# Patient Record
Sex: Male | Born: 1945
Health system: Southern US, Community
[De-identification: ages and names within clinical notes are randomized; demographics above are authoritative.]

## PROBLEM LIST (undated history)

## (undated) ENCOUNTER — Ambulatory Visit: Discharge status: Absent without leave | Payer: Medicare Other

## (undated) DIAGNOSIS — C61 Malignant neoplasm of prostate: Secondary | ICD-10-CM

## (undated) DIAGNOSIS — R972 Elevated prostate specific antigen [PSA]: Secondary | ICD-10-CM

## (undated) DIAGNOSIS — R0600 Dyspnea, unspecified: Secondary | ICD-10-CM

## (undated) DIAGNOSIS — N529 Male erectile dysfunction, unspecified: Secondary | ICD-10-CM

## (undated) DIAGNOSIS — J449 Chronic obstructive pulmonary disease, unspecified: Secondary | ICD-10-CM

## (undated) DIAGNOSIS — R0602 Shortness of breath: Secondary | ICD-10-CM

## (undated) DIAGNOSIS — R519 Headache, unspecified: Secondary | ICD-10-CM

## (undated) HISTORY — PX: ANAL FISSURE REPAIR: SHX2312

## (undated) HISTORY — DX: Male erectile dysfunction, unspecified: N52.9

## (undated) HISTORY — DX: Malignant neoplasm of prostate: C61

## (undated) HISTORY — DX: Elevated prostate specific antigen (PSA): R97.20

---

## 1990-10-30 HISTORY — PX: THORACIC FUSION: SHX1062

## 2005-06-27 ENCOUNTER — Ambulatory Visit (HOSPITAL_COMMUNITY): Admission: RE | Admit: 2005-06-27 | Discharge: 2005-06-27 | Payer: Self-pay | Admitting: Gastroenterology

## 2011-11-15 ENCOUNTER — Other Ambulatory Visit (HOSPITAL_COMMUNITY): Payer: Self-pay | Admitting: Family Medicine

## 2011-11-15 DIAGNOSIS — R0600 Dyspnea, unspecified: Secondary | ICD-10-CM

## 2011-11-20 ENCOUNTER — Ambulatory Visit (HOSPITAL_COMMUNITY)
Admission: RE | Admit: 2011-11-20 | Discharge: 2011-11-20 | Disposition: A | Discharge status: Home / Self Care | Payer: Medicare Other | Source: Ambulatory Visit | Attending: Family Medicine | Admitting: Family Medicine

## 2011-11-20 ENCOUNTER — Other Ambulatory Visit (HOSPITAL_COMMUNITY): Payer: Self-pay | Admitting: Respiratory Therapy

## 2011-11-20 DIAGNOSIS — R0989 Other specified symptoms and signs involving the circulatory and respiratory systems: Secondary | ICD-10-CM | POA: Insufficient documentation

## 2011-11-20 DIAGNOSIS — R0609 Other forms of dyspnea: Secondary | ICD-10-CM | POA: Insufficient documentation

## 2011-11-20 MED ORDER — ALBUTEROL SULFATE (5 MG/ML) 0.5% IN NEBU
2.5000 mg | INHALATION_SOLUTION | Freq: Once | RESPIRATORY_TRACT | Status: AC
  Administered 2011-11-20: 2.5 mg via RESPIRATORY_TRACT

## 2012-02-28 ENCOUNTER — Encounter: Payer: Self-pay | Admitting: Radiation Oncology

## 2012-02-28 DIAGNOSIS — C61 Malignant neoplasm of prostate: Secondary | ICD-10-CM

## 2012-02-28 NOTE — Progress Notes (Signed)
66 year old male. Widowed, 1 son, 1 daughter. Retired.   Patient denies taking any daily prescribed medications.  11/15/2011 PSA 5.3 11/20/2011 PSA 4.1  Prostate biopsy on 02/20/2012. Pathology revealed adenocarcinoma of the prostate Gleason 3+3=6 in 6/12 biopsies in 36 cc gland.   Gleason 6 in 5% of right mid-lateral, 10% of right medial apex, and 5% of left med apex, 5% of mid medial , 20% of left apex and 20% left mid lateral.   Leaning toward seed therapy per Dr. Imelda Pillow 02/27/12 note.    AX: codeine derivatives  No indication of pacemaker No hx of radiation therapy

## 2012-02-29 ENCOUNTER — Encounter: Payer: Self-pay | Admitting: Radiation Oncology

## 2012-02-29 ENCOUNTER — Ambulatory Visit
Admission: RE | Admit: 2012-02-29 | Discharge: 2012-02-29 | Disposition: A | Discharge status: Home / Self Care | Payer: Medicare Other | Source: Ambulatory Visit | Attending: Radiation Oncology | Admitting: Radiation Oncology

## 2012-02-29 VITALS — BP 132/77 | HR 62 | Temp 97.2°F | Resp 18 | Ht 67.0 in | Wt 163.5 lb

## 2012-02-29 DIAGNOSIS — C61 Malignant neoplasm of prostate: Secondary | ICD-10-CM

## 2012-02-29 DIAGNOSIS — F17200 Nicotine dependence, unspecified, uncomplicated: Secondary | ICD-10-CM | POA: Insufficient documentation

## 2012-02-29 DIAGNOSIS — Z8042 Family history of malignant neoplasm of prostate: Secondary | ICD-10-CM | POA: Insufficient documentation

## 2012-02-29 NOTE — Progress Notes (Signed)
Encounter addended by: Agnes Lawrence, RN on: 02/29/2012 10:48 AM<BR>     Documentation filed: Notes Section, Charges VN, Inpatient Patient Education

## 2012-02-29 NOTE — Progress Notes (Signed)
[facility] Radiation Oncology NEW PATIENT EVALUATION  Name: Brent Phelps MRN: [id number]  Date:   02/29/2012           DOB: Jan 26, 1946  Status: outpatient   CC: [name], MD, MD  Kathi Ludwig, *    REFERRING PHYSICIAN: Kathi Ludwig, *   DIAGNOSIS: Stage TI C. favorable risk adenocarcinoma prostate   HISTORY OF PRESENT ILLNESS:  Brent Phelps is a 66 y.o. male who is seen today for the courtesy of Dr. Patsi Sears for discussion of possible radiation therapy in the management of his stage TI C. favorable risk adenocarcinoma prostate. His then have an elevated PSA of 5.3 by Dr. Manus Gunning. This was apparently repeated and found to be 4.1 with a PSA free percentage of 13%. He was seen by Dr. Patsi Sears for further evaluation. He underwent ultrasound-guided biopsies on 02/20/2012. He was then had Gleason 6 (3+3) adenocarcinoma involving 5% of one core from the right lateral mid gland, 10% of one core from the right apex, 20% of one core from the left lateral mid gland, less than 5% of one core from the left mid gland, 20% of one core from left lateral apex and 5% of one core from the left apex. His prostate volume was 36 cc with a prosthetic length of 4.3 cm. He is doing well from a GU and GI standpoint. He previously had an anal fissure repair. His I PSS score is 5. He does have erectile dysfunction. He is not currently sexually active.  PREVIOUS RADIATION THERAPY: No   PAST MEDICAL HISTORY:  has a past medical history of Prostate cancer; Erectile dysfunction; and Elevated prostate specific antigen (PSA).     PAST SURGICAL HISTORY:  Past Surgical History  Procedure Date  . Back surgery   . Back surgery   . Colonoscopy   . Prostate biopsy   . Anal fissure repair      FAMILY HISTORY: family history includes Cancer in his brothers and sister. His father died from a postoperative hemorrhage a 35. His mother is alive and well and lives independently  at age 52. His brother was diagnosed with prostate cancer approximately 15 years ago in his early 31s.   SOCIAL HISTORY:  reports that he has been smoking Cigarettes.  He has a 25 pack-year smoking history. He has never used smokeless tobacco. He reports that he does not drink alcohol or use illicit drugs. Widower for the past 6 years, 2 children. 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   ALLERGIES: Codeine   MEDICATIONS:  Current Outpatient Prescriptions  Medication Sig Dispense Refill  . acetaminophen (TYLENOL) 325 MG tablet Take 650 mg by mouth every 6 (six) hours as needed. As needed         REVIEW OF SYSTEMS:  Pertinent items are noted in HPI.    PHYSICAL EXAM:  height is 5\' 7"  (1.702 m) and weight is 163 lb 8 oz (74.163 kg). His oral temperature is 97.2 F (36.2 C). His blood pressure is 132/77 and his pulse is 62. His respiration is 18.   Alert and oriented 66 year old white male appearing his stated age. Head and neck examination grossly unremarkable. Nodes: Without palpable cervical or supraclavicular lymphadenopathy. Chest: Lungs clear. Back: Without spinal or CVA tenderness. Heart: Regular rate rhythm. Abdomen: Without masses organomegaly. Genitalia: Unremarkable to inspection. Rectal: The prostate gland is normal in size and is without focal induration or nodularity. Extremities: Without  edema. Neurologic examination: Grossly nonfocal.   LABORATORY DATA:  No results found for this basename: WBC, HGB, HCT, MCV, PLT   No results found for this basename: NA, K, CL, CO2   No results found for this basename: ALT, AST, GGT, ALKPHOS, BILITOT   PSA from 11/20/2011   4.1   IMPRESSION: Stage TI C. favorable risk adenocarcinoma prostate. I explained to the patient that his prognosis is related to his stage, PSA level, and Gleason score. All are favorable. We discussed surgery versus close surveillance versus radiation therapy. His radiation therapy  options include seed implantation alone or 8 weeks of external beam/IMRT. We discussed the potential acute and late toxicities of radiation therapy. We also discussed radiation safety issues. Other to radiation therapy options, he would lean toward seed implantation which I think would be a good choice for him. I understand that you'll meet with Dr. Laverle Patter to discuss the option of robotic prostatectomy. The patient to call me if he wants to proceed with radiation therapy. He was given literature for review.   PLAN: As discussed above. He will meet with Dr. Laverle Patter in the near future. The patient is to call me if he wants to proceed with radiation therapy.   I spent 60 minutes minutes face to face with the patient and more than 50% of that time was spent in counseling and/or coordination of care.

## 2012-02-29 NOTE — Progress Notes (Signed)
Patient presents to the clinic today unaccompanied for a new consult with Dr. Dayton Scrape reference prostate ca. Patient is alert and oriented to person, place, and time. No distress noted. Steady gait noted. Pleasant affect noted. Patient denies pain at this time. Patient reports on average he gets up once per night to void. Patient reports his weight remains stable and he has a good appetite. Patient denies burning with urination. Patient denies hematuria. Patient denies incontinence. Patient reports on average he has a BM every 2 or 3rd day and its of normal consistency. Patient reports alteration between a strong and weak urine stream. Patient reports sleeping without difficulty. Patient denies nausea, vomiting, headache and dizziness. Reported all findings to Dr. Dayton Scrape.

## 2012-02-29 NOTE — Progress Notes (Signed)
Complete PATIENT MEASURE OF DISTRESS worksheet with a score of 0 submitted to social work. Also, complete NUTRITION RISK SCREEN worksheet without concerns submitted to Barbara Neff, RD 

## 2012-02-29 NOTE — Progress Notes (Signed)
See progress note under physician encounter. 

## 2012-03-19 ENCOUNTER — Encounter: Payer: Self-pay | Admitting: Radiation Oncology

## 2012-03-19 NOTE — Progress Notes (Signed)
Chart note: I spoke with [name] and he wants to proceed with a prostate seed implant. I feel that he would be a good candidate. He is at town next week, so I'll have him return the first week in June for his CT arch study. I anticipate a seed implant sometime in July.

## 2012-03-20 ENCOUNTER — Telehealth: Payer: Self-pay | Admitting: *Deleted

## 2012-03-20 NOTE — Telephone Encounter (Signed)
Called patient to inform of CT arch study on 04-01-12 at 10:00 a.m., lvm for a return call

## 2012-03-29 ENCOUNTER — Other Ambulatory Visit: Payer: Self-pay | Admitting: Urology

## 2012-03-29 ENCOUNTER — Telehealth: Payer: Self-pay | Admitting: *Deleted

## 2012-03-29 NOTE — Telephone Encounter (Signed)
Called patient to remind of pre-seed appt. For 04-01-12 at 10:00 a.m.Marland Kitchen lvm for a return call

## 2012-04-01 ENCOUNTER — Ambulatory Visit
Admission: RE | Admit: 2012-04-01 | Discharge: 2012-04-01 | Disposition: A | Discharge status: Home / Self Care | Payer: Medicare Other | Source: Ambulatory Visit | Attending: Radiation Oncology | Admitting: Radiation Oncology

## 2012-04-01 ENCOUNTER — Ambulatory Visit: Payer: Medicare Other

## 2012-04-01 ENCOUNTER — Ambulatory Visit (HOSPITAL_BASED_OUTPATIENT_CLINIC_OR_DEPARTMENT_OTHER)
Admission: RE | Admit: 2012-04-01 | Discharge: 2012-04-01 | Disposition: A | Discharge status: Home / Self Care | Payer: Medicare Other | Source: Ambulatory Visit | Attending: Urology | Admitting: Urology

## 2012-04-01 ENCOUNTER — Encounter: Payer: Self-pay | Admitting: Radiation Oncology

## 2012-04-01 DIAGNOSIS — C61 Malignant neoplasm of prostate: Secondary | ICD-10-CM

## 2012-04-01 NOTE — Progress Notes (Signed)
   Complex simulation/treatment planning note:  The patient was taken to the CT simulator for his CT arch study. His pelvis was scanned. I contoured the prostate and projected the prostate over the pubic arch to assess bony arch interference. There is no arch interference. His prostate volume is 35.1 cc with a prosthetic length of 3.9 cm, closely correlating with Dr. Retta Diones volume of 36 cc. I prescribing 14,500 cGy utilizing I-125 seeds. He'll be implanted with Nucletron system. He'll undergo a real-time planning. Consent is signed today.

## 2012-04-03 ENCOUNTER — Telehealth: Payer: Self-pay | Admitting: *Deleted

## 2012-04-03 NOTE — Telephone Encounter (Signed)
Called patient to inform that implant date has been moved to 06-20-12 at 9:30 a.m., lvm for a return call

## 2012-05-29 ENCOUNTER — Telehealth: Payer: Self-pay | Admitting: *Deleted

## 2012-05-29 NOTE — Telephone Encounter (Signed)
CALLED PATIENT TO REMIND OF APPT. FOR 06-17-12, SPOKE WITH PATIENT AND HE IS AWARE OF THIS APPT.

## 2012-06-17 ENCOUNTER — Encounter (HOSPITAL_BASED_OUTPATIENT_CLINIC_OR_DEPARTMENT_OTHER): Payer: Self-pay | Admitting: *Deleted

## 2012-06-17 LAB — CBC
HCT: 42.7 % (ref 39.0–52.0)
MCV: 90.7 fL (ref 78.0–100.0)
RBC: 4.71 MIL/uL (ref 4.22–5.81)
WBC: 5.1 10*3/uL (ref 4.0–10.5)

## 2012-06-17 LAB — COMPREHENSIVE METABOLIC PANEL
BUN: 21 mg/dL (ref 6–23)
CO2: 28 mEq/L (ref 19–32)
Chloride: 102 mEq/L (ref 96–112)
Creatinine, Ser: 0.84 mg/dL (ref 0.50–1.35)
GFR calc non Af Amer: 88 mL/min — ABNORMAL LOW (ref >90)
Total Bilirubin: 0.7 mg/dL (ref 0.3–1.2)

## 2012-06-17 LAB — PROTIME-INR
INR: 0.87 (ref 0.00–1.49)
Prothrombin Time: 12 seconds (ref 11.6–15.2)

## 2012-06-17 NOTE — Progress Notes (Signed)
NPO AFTER MN. ARRIVES AT 0815. CURRENT LAB RESULTS IN EPIC . CURRENT EKG AND CXR IN EPIC AND CHART.  WILL DO FLEET ENEMA AM OF SURG.

## 2012-06-19 ENCOUNTER — Telehealth: Payer: Self-pay | Admitting: *Deleted

## 2012-06-19 NOTE — Telephone Encounter (Signed)
Called patient to remind of procedure for 06-20-12, spoke with patient and he is aware of this procedure.

## 2012-06-20 ENCOUNTER — Encounter (HOSPITAL_BASED_OUTPATIENT_CLINIC_OR_DEPARTMENT_OTHER)
Admission: RE | Disposition: A | Discharge status: Home / Self Care | Payer: Self-pay | Source: Ambulatory Visit | Attending: Urology

## 2012-06-20 ENCOUNTER — Ambulatory Visit (HOSPITAL_COMMUNITY): Payer: Medicare Other

## 2012-06-20 ENCOUNTER — Ambulatory Visit (HOSPITAL_BASED_OUTPATIENT_CLINIC_OR_DEPARTMENT_OTHER)
Admission: RE | Admit: 2012-06-20 | Discharge: 2012-06-20 | Disposition: A | Discharge status: Home / Self Care | Payer: Medicare Other | Source: Ambulatory Visit | Attending: Urology | Admitting: Urology

## 2012-06-20 ENCOUNTER — Ambulatory Visit (HOSPITAL_BASED_OUTPATIENT_CLINIC_OR_DEPARTMENT_OTHER): Payer: Medicare Other | Admitting: Anesthesiology

## 2012-06-20 ENCOUNTER — Encounter (HOSPITAL_BASED_OUTPATIENT_CLINIC_OR_DEPARTMENT_OTHER): Payer: Self-pay | Admitting: Anesthesiology

## 2012-06-20 ENCOUNTER — Encounter: Payer: Self-pay | Admitting: Radiation Oncology

## 2012-06-20 ENCOUNTER — Encounter (HOSPITAL_BASED_OUTPATIENT_CLINIC_OR_DEPARTMENT_OTHER): Payer: Self-pay | Admitting: *Deleted

## 2012-06-20 DIAGNOSIS — C61 Malignant neoplasm of prostate: Secondary | ICD-10-CM | POA: Insufficient documentation

## 2012-06-20 DIAGNOSIS — Z01812 Encounter for preprocedural laboratory examination: Secondary | ICD-10-CM | POA: Insufficient documentation

## 2012-06-20 DIAGNOSIS — N529 Male erectile dysfunction, unspecified: Secondary | ICD-10-CM | POA: Insufficient documentation

## 2012-06-20 DIAGNOSIS — Z8042 Family history of malignant neoplasm of prostate: Secondary | ICD-10-CM | POA: Insufficient documentation

## 2012-06-20 DIAGNOSIS — R972 Elevated prostate specific antigen [PSA]: Secondary | ICD-10-CM | POA: Insufficient documentation

## 2012-06-20 HISTORY — DX: Shortness of breath: R06.02

## 2012-06-20 HISTORY — PX: RADIOACTIVE SEED IMPLANT: SHX5150

## 2012-06-20 HISTORY — PX: CYSTOSCOPY: SHX5120

## 2012-06-20 SURGERY — INSERTION, RADIATION SOURCE, PROSTATE
Anesthesia: General | Site: Prostate | Wound class: Clean Contaminated

## 2012-06-20 MED ORDER — FLEET ENEMA 7-19 GM/118ML RE ENEM: 1.0000 | ENEMA | Freq: Once | RECTAL | Status: DC

## 2012-06-20 MED ORDER — FENTANYL CITRATE 0.05 MG/ML IJ SOLN
INTRAMUSCULAR | Status: DC | PRN
  Administered 2012-06-20 (×4): 25 ug via INTRAVENOUS
  Administered 2012-06-20: 50 ug via INTRAVENOUS
  Administered 2012-06-20 (×2): 25 ug via INTRAVENOUS
  Administered 2012-06-20: 100 ug via INTRAVENOUS

## 2012-06-20 MED ORDER — IOHEXOL 350 MG/ML SOLN
INTRAVENOUS | Status: DC | PRN
  Administered 2012-06-20: 7 mL

## 2012-06-20 MED ORDER — LACTATED RINGERS IV SOLN
INTRAVENOUS | Status: DC
  Administered 2012-06-20: 09:00:00 via INTRAVENOUS

## 2012-06-20 MED ORDER — TAMSULOSIN HCL 0.4 MG PO CAPS: 0.4000 mg | ORAL_CAPSULE | Freq: Every day | ORAL | Status: DC

## 2012-06-20 MED ORDER — STERILE WATER FOR IRRIGATION IR SOLN
Status: DC | PRN
  Administered 2012-06-20: 3000 mL

## 2012-06-20 MED ORDER — TRAMADOL-ACETAMINOPHEN 37.5-325 MG PO TABS: 1.0000 | ORAL_TABLET | Freq: Four times a day (QID) | ORAL | Status: AC | PRN

## 2012-06-20 MED ORDER — KETOROLAC TROMETHAMINE 30 MG/ML IJ SOLN
INTRAMUSCULAR | Status: DC | PRN
Start: 2012-06-20 — End: 2012-06-20
  Administered 2012-06-20: 30 mg via INTRAVENOUS

## 2012-06-20 MED ORDER — DEXAMETHASONE SODIUM PHOSPHATE 4 MG/ML IJ SOLN
INTRAMUSCULAR | Status: DC | PRN
  Administered 2012-06-20: 10 mg via INTRAVENOUS

## 2012-06-20 MED ORDER — GLYCOPYRROLATE 0.2 MG/ML IJ SOLN
INTRAMUSCULAR | Status: DC | PRN
  Administered 2012-06-20 (×2): 0.2 mg via INTRAVENOUS

## 2012-06-20 MED ORDER — LACTATED RINGERS IV SOLN
INTRAVENOUS | Status: DC | PRN
  Administered 2012-06-20 (×3): via INTRAVENOUS

## 2012-06-20 MED ORDER — MIDAZOLAM HCL 5 MG/5ML IJ SOLN
INTRAMUSCULAR | Status: DC | PRN
  Administered 2012-06-20: 1 mg via INTRAVENOUS
  Administered 2012-06-20: 2 mg via INTRAVENOUS

## 2012-06-20 MED ORDER — FENTANYL CITRATE 0.05 MG/ML IJ SOLN: 25.0000 ug | INTRAMUSCULAR | Status: DC | PRN

## 2012-06-20 MED ORDER — PROMETHAZINE HCL 25 MG/ML IJ SOLN: 6.2500 mg | INTRAMUSCULAR | Status: DC | PRN

## 2012-06-20 MED ORDER — ACETAMINOPHEN 10 MG/ML IV SOLN
INTRAVENOUS | Status: DC | PRN
  Administered 2012-06-20: 1000 mg via INTRAVENOUS

## 2012-06-20 MED ORDER — LIDOCAINE HCL (CARDIAC) 20 MG/ML IV SOLN
INTRAVENOUS | Status: DC | PRN
  Administered 2012-06-20: 100 mg via INTRAVENOUS

## 2012-06-20 MED ORDER — PROPOFOL 10 MG/ML IV EMUL
INTRAVENOUS | Status: DC | PRN
  Administered 2012-06-20: 200 mg via INTRAVENOUS

## 2012-06-20 MED ORDER — CIPROFLOXACIN IN D5W 400 MG/200ML IV SOLN
400.0000 mg | INTRAVENOUS | Status: AC
  Administered 2012-06-20: 400 mg via INTRAVENOUS

## 2012-06-20 MED ORDER — CIPROFLOXACIN HCL 500 MG PO TABS: 500.0000 mg | ORAL_TABLET | Freq: Two times a day (BID) | ORAL | Status: AC

## 2012-06-20 MED ORDER — CIPROFLOXACIN IN D5W 400 MG/200ML IV SOLN
INTRAVENOUS | Status: DC | PRN
  Administered 2012-06-20: 400 mg via INTRAVENOUS

## 2012-06-20 MED ORDER — ONDANSETRON HCL 4 MG/2ML IJ SOLN
INTRAMUSCULAR | Status: DC | PRN
  Administered 2012-06-20: 4 mg via INTRAVENOUS

## 2012-06-20 SURGICAL SUPPLY — 28 items
BAG URINE DRAINAGE (UROLOGICAL SUPPLIES) ×4 IMPLANT
BLADE SURG ROTATE 9660 (MISCELLANEOUS) ×4 IMPLANT
CATH FOLEY 2WAY SLVR  5CC 16FR (CATHETERS) ×2
CATH FOLEY 2WAY SLVR 5CC 16FR (CATHETERS) ×6 IMPLANT
CATH ROBINSON RED A/P 20FR (CATHETERS) ×4 IMPLANT
CLOTH BEACON ORANGE TIMEOUT ST (SAFETY) ×4 IMPLANT
COVER MAYO STAND STRL (DRAPES) ×4 IMPLANT
COVER TABLE BACK 60X90 (DRAPES) ×4 IMPLANT
DRAPE CAMERA CLOSED 9X96 (DRAPES) ×4 IMPLANT
DRSG TEGADERM 4X4.75 (GAUZE/BANDAGES/DRESSINGS) ×4 IMPLANT
DRSG TEGADERM 8X12 (GAUZE/BANDAGES/DRESSINGS) ×4 IMPLANT
GLOVE BIO SURGEON STRL SZ7.5 (GLOVE) ×13 IMPLANT
GLOVE ECLIPSE 8.0 STRL XLNG CF (GLOVE) IMPLANT
GLOVE INDICATOR 6.5 STRL GRN (GLOVE) ×4 IMPLANT
GOWN STRL NON-REIN LRG LVL3 (GOWN DISPOSABLE) ×1 IMPLANT
GOWN STRL REIN XL XLG (GOWN DISPOSABLE) ×4 IMPLANT
GOWN XL W/COTTON TOWEL STD (GOWNS) ×2 IMPLANT
HOLDER FOLEY CATH W/STRAP (MISCELLANEOUS) ×4 IMPLANT
IV WATER IRR. 1000ML (IV SOLUTION) ×3 IMPLANT
NUCLETRON SELECTSEED ×1 IMPLANT
PACK CYSTOSCOPY (CUSTOM PROCEDURE TRAY) ×4 IMPLANT
SPONGE GAUZE 4X4 12PLY (GAUZE/BANDAGES/DRESSINGS) ×1 IMPLANT
SYRINGE 10CC LL (SYRINGE) ×4 IMPLANT
SYRINGE IRR TOOMEY STRL 70CC (SYRINGE) ×1 IMPLANT
TOWEL NATURAL 6PK STERILE (DISPOSABLE) ×2 IMPLANT
UNDERPAD 30X30 INCONTINENT (UNDERPADS AND DIAPERS) ×8 IMPLANT
WATER STERILE IRR 3000ML UROMA (IV SOLUTION) ×2 IMPLANT
WATER STERILE IRR 500ML POUR (IV SOLUTION) ×4 IMPLANT

## 2012-06-20 NOTE — Anesthesia Preprocedure Evaluation (Signed)
Anesthesia Evaluation  Patient identified by MRN, date of birth, ID band Patient awake    Reviewed: Allergy & Precautions, H&P , NPO status , Patient's Chart, lab work & pertinent test results  Airway Mallampati: II TM Distance: >3 FB Neck ROM: Full    Dental No notable dental hx.    Pulmonary shortness of breath,  breath sounds clear to auscultation  Pulmonary exam normal       Cardiovascular negative cardio ROS  Rhythm:Regular Rate:Normal     Neuro/Psych PSYCHIATRIC DISORDERS negative neurological ROS     GI/Hepatic negative GI ROS, Neg liver ROS,   Endo/Other  negative endocrine ROS  Renal/GU negative Renal ROS  negative genitourinary   Musculoskeletal negative musculoskeletal ROS (+)   Abdominal   Peds negative pediatric ROS (+)  Hematology negative hematology ROS (+)   Anesthesia Other Findings   Reproductive/Obstetrics negative OB ROS                           Anesthesia Physical Anesthesia Plan  ASA: II  Anesthesia Plan: General   Post-op Pain Management:    Induction: Intravenous  Airway Management Planned: LMA  Additional Equipment:   Intra-op Plan:   Post-operative Plan: Extubation in OR  Informed Consent: I have reviewed the patients History and Physical, chart, labs and discussed the procedure including the risks, benefits and alternatives for the proposed anesthesia with the patient or authorized representative who has indicated his/her understanding and acceptance.   Dental advisory given  Plan Discussed with: CRNA  Anesthesia Plan Comments:         Anesthesia Quick Evaluation

## 2012-06-20 NOTE — Transfer of Care (Signed)
Immediate Anesthesia Transfer of Care Note  Patient: Brent Phelps  Procedure(s) Performed: Procedure(s) (LRB): RADIOACTIVE SEED IMPLANT (N/A) CYSTOSCOPY FLEXIBLE (N/A) CYSTOSCOPY ()  Patient Location: PACU  Anesthesia Type: General  Level of Consciousness: awake, sedated, patient cooperative and responds to stimulation  Airway & Oxygen Therapy: Patient Spontanous Breathing and Patient connected to face mask oxygen  Post-op Assessment: Report given to PACU RN, Post -op Vital signs reviewed and stable and Patient moving all extremities  Post vital signs: Reviewed and stable  Complications: No apparent anesthesia complications

## 2012-06-20 NOTE — Progress Notes (Signed)
End of treatment summary:  Diagnosis: Stage TI C. favorable risk adenocarcinoma prostate  Requesting physician: Dr. Alexis Frock  Intent: Curative  Implant date: 06/20/2012  Site/dose: Prostate 14,500 cGy  Isotope: I-125 utilizing 83 seeds and 28 active needles. Individual seed activity 0.427 mCi per seed for a total implant activity of 35.4 mCi  Narrative: [name] appears to undergone a successful Nucletron seed Selectron implant with Dr. Patsi Sears.  Plan: Followup visit in 3 weeks to see me and Dr. Patsi Sears. He'll have a CT scan at that time to assess the quality of his implant.

## 2012-06-20 NOTE — H&P (Signed)
omplaint  cc: Dr. Madie Phelps   Active Problems Problems  1. Benign Localized Prostatic Hyperplasia With Urinary Obstruction 600.21 2. Male Erectile Disorder Due To Physical Condition 607.84 3. Fraternal history of  Prostate Cancer V16.42 4. Prostate Cancer 185 5. PSA,Elevated 790.93  History of Present Illness                66 yo widowed male returns today s/p prostate biopsy on 02/20/12, originally referred by Dr. Manus Phelps for elevated PSA of 4.1/13% on 11-20-11. ( Was 5.3 on original test).     Patient has a weak flow & ED. IPSS=7/7.  Bx= G 3+3=6 caP in 6/12 biopsies, in 36cc gland:  He has G 6 in 5% of R mid-lateral biopsies, and 10% of R medial apes; and G=6 in 5% of L med apex, and G=6 in 5% of mid-medial biopsies. Note G=6 in 20% of L apex; and 20% in L mid-lateral biopsies. We have discussed determinants of treatment, including his general health and his expectations. He has only 1% chance of spread of disease ( Partin tables), and does not need CT or bone scan. He therefore needs to consider treatments, which fall into 3 ctegories: non-surgical; treatments for cure; and treatments for advanced disease.   We have discussed (1) no treatment option-for patients who are dying from other diseases before the prostate could become a lyfestyle factor; (2) watchful waiting therapy, for patients with low grade disease and other diseases, eg: heart disease, which may preclude treatment unless disease spreads; (3) watchful waiting protocol, with repeat psas and repeat biopsy in 10-16 months; (4) treatment for cure with LRRP; radiation with I-125 seed Rs, EBRT and IMRT. We have discussed the risk/benefit of all treatments.Other Rx include hormone therapy, chemotherapy, research therapies-none of which he needs now.  We have discussed 2nd opinions with Dr. Laverle Phelps and Dr. Dayton Phelps. He is given literature to read and will RTC to discuss his options again. He has decided  to have appointment with 2nd  opinion-Dr. Dayton Phelps, so I will arrange for him. He is leaning toward seed therapy.   Past Medical History Problems  1. History of  No Medical Problems  Surgical History Problems  1. History of  Back Surgery 2. History of  Back Surgery 3. History of  Complete Colonoscopy  Current Meds 1. No Reported Medications  Allergies Medication  1. Codeine Derivatives  Family History Problems  1. Fraternal history of  Colon Cancer V16.0 2. Family history of  Family Health Status - Mother's Age 70. Family history of  Family Health Status Number Of Children 1 son, 1 daughter 4. Family history of  Father Deceased At Age 49 post-op shock 5. Fraternal history of  Prostate Cancer V16.42  Social History Problems  1. Caffeine Use 1 per day 2. Marital History - Widowed 3. Occupation: Retired 4. Smoking Cigarettes 305.1 3/4 ppd x 37yrs Denied  5. History of  Alcohol Use  Vitals Vital Signs [Data Includes: Last 1 Day]  21May2013 08:14AM  BMI Calculated: 25.74 BSA Calculated: 1.86 Height: 5 ft 7 in Weight: 164 lb  Blood Pressure: 141 / 84 Temperature: 97.7 F Heart Rate: 65  Physical Exam Rectal: Rectal exam demonstrates decreased sphincter tone, no tenderness and no masses. Estimated prostate size is 3+. Normal rectal tone, no rectal masses, prostate is smooth, symmetric and non-tender. The prostate has no nodularity and is not tender. The left seminal vesicle is nonpalpable. The right seminal vesicle is nonpalpable. The perineum is normal  on inspection.    Results/Data Urine [Data Includes: Last 1 Day]   21May2013  COLOR YELLOW   APPEARANCE CLEAR   SPECIFIC GRAVITY <1.005   pH 6.0   GLUCOSE NEG mg/dL  BILIRUBIN NEG   KETONE NEG mg/dL  BLOOD TRACE   PROTEIN NEG mg/dL  UROBILINOGEN 0.2 mg/dL  NITRITE NEG   LEUKOCYTE ESTERASE NEG   SQUAMOUS EPITHELIAL/HPF RARE   WBC NONE SEEN WBC/hpf  RBC NONE SEEN RBC/hpf  BACTERIA NONE SEEN   CRYSTALS NONE SEEN   CASTS NONE SEEN     Assessment Assessed  1. Benign Localized Prostatic Hyperplasia With Urinary Obstruction 600.21 2. Male Erectile Disorder Due To Physical Condition 607.84 3. Fraternal history of  Prostate Cancer V16.42 4. Prostate Cancer 185 5. PSA,Elevated 790.93   66 yo male with G-6 multifocal T1C CaP with strong family genetic link to CaP ( brother had RRP), in 35cc gland with PSA 4.1-5.3. PMH tobacco use and ED. He has decided to have Radiation therapy, and we have discussed seed therapies including:  I 125 seeds alone, EBRT + seeds; hormone (6 month)+ seeds; hormone ( 6 month)+ EBRT + seeds. I have discouraged IMRT because I don't think he has aggressive enough disease ( multifocal, but only G6) to warrent  potential of long term side effects of high dose radiation ( bleeding, etc). I would ask for Dr. Rennie Phelps advice in this regard and scheduling.    Plan     Ask Dr.Murray to proceed with plan for I-125 seeds. He will need Arch study per Dr, Brent Phelps.   Signatures Electronically signed by : Jethro Bolus, M.D.; Mar 19 2012  8:55AM

## 2012-06-20 NOTE — Op Note (Signed)
Pre-operative diagnosis : Adenocarcinoma prostate, stage TI C.  Postoperative diagnosis: Same  Operation: I-125 seed implantation  Surgeon:  S. Patsi Sears, MD  First assistant: None  Radiation Therapist: Dr. Chipper Herb  Anesthesia:  general  Preparation: After appropriate preanesthesia, the patient was brought to the operative room, placed on the operating table in the dorsal supine position, where general LMA anesthesia was introduced. The arm band was double checked. He was then placed in the dorsal lithotomy position with pubis was prepped with Betadine solution and draped in usual fashion.  Review history: Active Problems  Problems  1. Benign Localized Prostatic Hyperplasia With Urinary Obstruction 600.21  2. Male Erectile Disorder Due To Physical Condition 607.84  3. Fraternal history of Prostate Cancer V16.42  4. Prostate Cancer 185  5. PSA,Elevated 790.93  History of Present Illness  66 yo widowed male returns today s/p prostate biopsy on 02/20/12, originally referred by Dr. Manus Gunning for elevated PSA of 4.1/13% on 11-20-11. ( Was 5.3 on original test).  Patient has a weak flow & ED. IPSS=7/7. Bx= G 3+3=6 caP in 6/12 biopsies, in 36cc gland:  He has G 6 in 5% of R mid-lateral biopsies, and 10% of R medial apes; and G=6 in 5% of L med apex, and G=6 in 5% of mid-medial biopsies. Note G=6 in 20% of L apex; and 20% in L mid-lateral biopsies. We have discussed determinants of treatment, including his general health and his expectations. He has only 1% chance of spread of disease ( Partin tables), and does not need CT or bone scan. He therefore needs to consider treatments, which fall into 3 ctegories: non-surgical; treatments for cure; and treatments for advanced disease.    Statement of  Likelihood of Success: Excellent. TIME-OUT observed.:  Procedure: Following real-time ultrasound planning, the patient underwent I-125 seed implantation, with 83 seeds, via 30 needles. The patient  had a large left seminal vesicle cyst identified. Following implantation of the seeds, x-ray documentation was accomplished. Following this, flexural cystoscopy was accomplished, which shows spacer end within the bladder. There was some clot in the bladder. No seed could be identified within the bladder, or the prostate. Clot was evacuated from the bladder, and was evaluated, but again, no radioactivity was identified. No seed was identified. Rigid cystoscopy was accomplished, and again,or cecum be identified. No radioactivity could be identified as well.  The Foley catheter was placed to straight drainage, with 10 cc in the balloon. The patient received IV Tylenol, IV Toradol, as well as B. and O. suppository. He was awakened, taken to recovery room in good condition.

## 2012-06-20 NOTE — Interval H&P Note (Signed)
History and Physical Interval Note:  06/20/2012 10:45 AM  Brent Phelps  has presented today for surgery, with the diagnosis of PROSTATE CANCER  The various methods of treatment have been discussed with the patient and family. After consideration of risks, benefits and other options for treatment, the patient has consented to  Procedure(s) (LRB): RADIOACTIVE SEED IMPLANT (N/A) CYSTOSCOPY FLEXIBLE (N/A) as a surgical intervention .  The patient's history has been reviewed, patient examined, no change in status, stable for surgery.  I have reviewed the patient's chart and labs.  Questions were answered to the patient's satisfaction.     Jethro Bolus I

## 2012-06-20 NOTE — Anesthesia Procedure Notes (Signed)
Procedure Name: LMA Insertion Date/Time: 06/20/2012 11:34 AM Performed by: Jessica Priest Pre-anesthesia Checklist: Patient identified, Emergency Drugs available, Suction available and Patient being monitored Patient Re-evaluated:Patient Re-evaluated prior to inductionOxygen Delivery Method: Circle System Utilized Preoxygenation: Pre-oxygenation with 100% oxygen Intubation Type: IV induction Ventilation: Mask ventilation without difficulty LMA: LMA inserted LMA Size: 4.0 Number of attempts: 1 Airway Equipment and Method: bite block Placement Confirmation: positive ETCO2 Tube secured with: Tape Dental Injury: Teeth and Oropharynx as per pre-operative assessment

## 2012-06-20 NOTE — Progress Notes (Signed)
[facility] Radiation Oncology Brachytherapy Operative Procedure Note  Name: Brent Phelps MRN: [id number]  Date:   03/29/2012           DOB: 02-19-1946  Status:outpatient    EX:[id number],GMWNUU R, MD  Dr. Alexis Frock DIAGNOSIS: A 66 year old gentlemen with stage T1 C. adenocarcinoma of the prostate with a Gleason of 6 and a PSA of 5.3.  PROCEDURE: Insertion of radioactive I-125 seeds into the prostate gland.  RADIATION DOSE: 145 Gy, definitive therapy.  TECHNIQUE: [name] was brought to the operating room with Dr. Patsi Sears. He was placed in the dorsolithotomy position. He was catheterized and a rectal tube was inserted. The perineum was shaved, prepped and draped. The ultrasound probe was then introduced into the rectum to see the prostate gland.  TREATMENT DEVICE: A needle grid was attached to the ultrasound probe stand and anchor needles were placed.  COMPLEX ISODOSE CALCULATION: The prostate was imaged in 3D using a sagittal sweep of the prostate probe. These images were transferred to the planning computer. There, the prostate, urethra and rectum were defined on each axial reconstructed image. Then, the software created an optimized plan and a few seed positions were adjusted. Then the accepted plan was uploaded to the seed Selectron afterloading unit.  SPECIAL TREATMENT PROCEDURE/SUPERVISION AND HANDLING: The Nucletron FIRST system was used to place the needles under sagittal guidance. A total of 28 needles were used to deposit 83 seeds in the prostate gland. The individual seed activity was 0.43 mCi for a total implant activity of 35.4 mCi.  COMPLEX SIMULATION: At the end of the procedure, an anterior radiograph of the pelvis was obtained to document seed positioning and count. Cystoscopy was performed to check the urethra and bladder.  MICRODOSIMETRY: At the end of the procedure, the patient was emitting 0.8 mrem/hr at 1 meter. Accordingly, he was  considered safe for hospital discharge.  PLAN: The patient will return to the radiation oncology clinic for post implant CT dosimetry in three weeks.

## 2012-06-21 ENCOUNTER — Encounter (HOSPITAL_BASED_OUTPATIENT_CLINIC_OR_DEPARTMENT_OTHER): Payer: Self-pay | Admitting: Urology

## 2012-06-21 NOTE — Anesthesia Postprocedure Evaluation (Signed)
  Anesthesia Post Note  Patient: Brent Phelps  Procedure(s) Performed: Procedure(s) (LRB): RADIOACTIVE SEED IMPLANT (N/A) CYSTOSCOPY FLEXIBLE (N/A) CYSTOSCOPY ()  Anesthesia type: General  Patient location: PACU  Post pain: Pain level controlled  Post assessment: Post-op Vital signs reviewed  Last Vitals:  Filed Vitals:   06/20/12 1529  BP: 114/78  Pulse: 64  Temp: 36.1 C  Resp: 18    Post vital signs: Reviewed  Level of consciousness: sedated  Complications: No apparent anesthesia complications

## 2012-06-25 NOTE — Procedures (Signed)
Author:  Maryln Gottron, MD  Service:  Radiation Oncology  Author Type:  Physician   Filed:  06/20/12 1523  Note Time:  06/20/12 1520          [facility]  Radiation Oncology  Brachytherapy Operative Procedure Note  Name: Brent Phelps MRN: [id number]  Date: 03/29/2012 DOB: Apr 05, 1946  Status:outpatient  WU:[id number] R, MD Dr. Alexis Frock  DIAGNOSIS: A 66 year old gentlemen with stage T1 C. adenocarcinoma of the prostate with a Gleason of 6 and a PSA of 5.3.  PROCEDURE: Insertion of radioactive I-125 seeds into the prostate gland.  RADIATION DOSE: 145 Gy, definitive therapy.  TECHNIQUE: [name] was brought to the operating room with Dr. Patsi Sears. He was placed in the dorsolithotomy position. He was catheterized and a rectal tube was inserted. The perineum was shaved, prepped and draped. The ultrasound probe was then introduced into the rectum to see the prostate gland.  TREATMENT DEVICE: A needle grid was attached to the ultrasound probe stand and anchor needles were placed.  COMPLEX ISODOSE CALCULATION: The prostate was imaged in 3D using a sagittal sweep of the prostate probe. These images were transferred to the planning computer. There, the prostate, urethra and rectum were defined on each axial reconstructed image. Then, the software created an optimized plan and a few seed positions were adjusted. Then the accepted plan was uploaded to the seed Selectron afterloading unit.  SPECIAL TREATMENT PROCEDURE/SUPERVISION AND HANDLING: The Nucletron FIRST system was used to place the needles under sagittal guidance. A total of 28 needles were used to deposit 83 seeds in the prostate gland. The individual seed activity was 0.43 mCi for a total implant activity of 35.4 mCi.  COMPLEX SIMULATION: At the end of the procedure, an anterior radiograph of the pelvis was obtained to document seed positioning and count. Cystoscopy was performed to check the urethra and bladder.   MICRODOSIMETRY: At the end of the procedure, the patient was emitting 0.8 mrem/hr at 1 meter. Accordingly, he was considered safe for hospital discharge.  PLAN: The patient will return to the radiation oncology clinic for post implant CT dosimetry in three weeks.

## 2012-07-04 ENCOUNTER — Telehealth: Payer: Self-pay | Admitting: *Deleted

## 2012-07-04 NOTE — Telephone Encounter (Signed)
CALLED PATIENT TO INFORM OF APPT. CHANGES FOR 07-10-12 TO 07-17-12, LVM FOR A RETURN CALL

## 2012-07-10 ENCOUNTER — Ambulatory Visit: Payer: Medicare Other | Admitting: Radiation Oncology

## 2012-07-12 ENCOUNTER — Encounter: Payer: Self-pay | Admitting: Radiation Oncology

## 2012-07-16 ENCOUNTER — Telehealth: Payer: Self-pay | Admitting: *Deleted

## 2012-07-16 NOTE — Telephone Encounter (Signed)
CALLED PATIENT TO REMIND OF APPTS. FOR 07-17-12, LVM FOR A RETURN CALL

## 2012-07-17 ENCOUNTER — Ambulatory Visit
Admission: RE | Admit: 2012-07-17 | Discharge: 2012-07-17 | Disposition: A | Discharge status: Home / Self Care | Payer: Medicare Other | Source: Ambulatory Visit | Attending: Radiation Oncology | Admitting: Radiation Oncology

## 2012-07-17 ENCOUNTER — Encounter: Payer: Self-pay | Admitting: Radiation Oncology

## 2012-07-17 VITALS — BP 124/74 | HR 75 | Temp 97.7°F | Resp 20 | Wt 164.1 lb

## 2012-07-17 DIAGNOSIS — C61 Malignant neoplasm of prostate: Secondary | ICD-10-CM

## 2012-07-17 DIAGNOSIS — Z8042 Family history of malignant neoplasm of prostate: Secondary | ICD-10-CM | POA: Insufficient documentation

## 2012-07-17 DIAGNOSIS — F17200 Nicotine dependence, unspecified, uncomplicated: Secondary | ICD-10-CM | POA: Insufficient documentation

## 2012-07-17 NOTE — Progress Notes (Signed)
Complex simulation note: The patient was taken to the CT simulator. His prostate/pelvis was scanned. The CT data set was sent to the [facility] system for contouring of his prostate and rectum. We will then proceed with 3-D simulation to assess the quality of his seed implant.

## 2012-07-17 NOTE — Progress Notes (Signed)
Followup note:  Brent Phelps returns today approximately 1 month following his seed implant with Dr. Patsi Sears in the management of his stage TI C. favorable risk adenocarcinoma prostate. He is doing well from a GU and GI standpoint. He saw Dr. Patsi Sears last week and he'll see Dr. Patsi Sears back in December. His CT scan for his post implant dosimetry shows what appears to be an excellent seed distribution.  Physical examination: Not performed today  Impression: Satisfactory progress.  Plan: We'll move ahead with his post implant dosimetry and for the results of Dr. Patsi Sears. Dr. Patsi Sears will see the patient back for a followup visit and PSA determination in December. I have not scheduled the patient for a formal followup visit and I asked that Dr. Patsi Sears keep me posted on his progress.

## 2012-07-17 NOTE — Progress Notes (Signed)
Patient here for follow up post seed prostate implantation Done on 06/20/12  Patient alert,oriented x3 No dysuria, bowel movements good, urgency still but getting better,  No c/o pain, eating and drinking well stated 11:11 AM

## 2012-09-17 ENCOUNTER — Ambulatory Visit
Admission: RE | Admit: 2012-09-17 | Discharge: 2012-09-17 | Disposition: A | Discharge status: Home / Self Care | Payer: Medicare Other | Source: Ambulatory Visit | Attending: Radiation Oncology | Admitting: Radiation Oncology

## 2012-09-17 DIAGNOSIS — C61 Malignant neoplasm of prostate: Secondary | ICD-10-CM | POA: Insufficient documentation

## 2012-09-18 ENCOUNTER — Encounter: Payer: Self-pay | Admitting: Radiation Oncology

## 2012-09-18 NOTE — Progress Notes (Signed)
Post implant CT dosimetry/3-D simulation note: [name] underwent post-implant CT dosimetry/3-D simulation on 09/17/2012 to assess the quality of his prostate seed implant. Dose volume histograms were obtained for the prostate and rectum. His intraoperative prostate volume by ultrasound was 45.4 cc while his postoperative volume by CT was 38.0 cc, a reasonable correlation. His prostate D 90 was 110.1% and V100 93.73%, both excellent. Only 0.54 cc of rectum received the prescribed dose of 14,500 cGy. In summary, the patient has excellent post implant dosimetry with a low-risk for late rectal toxicity.  CC: Dr. Oda Kilts, Dr. Jeanmarie Hubert

## 2013-05-12 ENCOUNTER — Emergency Department (HOSPITAL_COMMUNITY)
Admission: EM | Admit: 2013-05-12 | Discharge: 2013-05-12 | Disposition: A | Discharge status: Home / Self Care | Payer: Medicare Other | Attending: Emergency Medicine | Admitting: Emergency Medicine

## 2013-05-12 ENCOUNTER — Encounter (HOSPITAL_COMMUNITY)
Admission: EM | Disposition: A | Discharge status: Home / Self Care | Payer: Self-pay | Source: Home / Self Care | Attending: Emergency Medicine

## 2013-05-12 ENCOUNTER — Emergency Department (HOSPITAL_COMMUNITY): Payer: Medicare Other

## 2013-05-12 ENCOUNTER — Encounter (HOSPITAL_COMMUNITY): Payer: Self-pay | Admitting: Anesthesiology

## 2013-05-12 ENCOUNTER — Emergency Department (HOSPITAL_COMMUNITY): Payer: Medicare Other | Admitting: Anesthesiology

## 2013-05-12 ENCOUNTER — Encounter (HOSPITAL_COMMUNITY): Payer: Self-pay | Admitting: *Deleted

## 2013-05-12 DIAGNOSIS — X58XXXA Exposure to other specified factors, initial encounter: Secondary | ICD-10-CM | POA: Insufficient documentation

## 2013-05-12 DIAGNOSIS — S0530XA Ocular laceration without prolapse or loss of intraocular tissue, unspecified eye, initial encounter: Secondary | ICD-10-CM | POA: Insufficient documentation

## 2013-05-12 DIAGNOSIS — S0232XA Fracture of orbital floor, left side, initial encounter for closed fracture: Secondary | ICD-10-CM

## 2013-05-12 DIAGNOSIS — S0592XA Unspecified injury of left eye and orbit, initial encounter: Secondary | ICD-10-CM

## 2013-05-12 DIAGNOSIS — S0280XA Fracture of other specified skull and facial bones, unspecified side, initial encounter for closed fracture: Secondary | ICD-10-CM | POA: Insufficient documentation

## 2013-05-12 HISTORY — PX: RUPTURED GLOBE EXPLORATION AND REPAIR: SHX2366

## 2013-05-12 LAB — BASIC METABOLIC PANEL
BUN: 19 mg/dL (ref 6–23)
Chloride: 102 mEq/L (ref 96–112)
GFR calc Af Amer: >90 mL/min (ref >90)
Potassium: 3.8 mEq/L (ref 3.5–5.1)

## 2013-05-12 LAB — CBC
HCT: 39.2 % (ref 39.0–52.0)
RBC: 4.43 MIL/uL (ref 4.22–5.81)
RDW: 13.1 % (ref 11.5–15.5)
WBC: 3.8 10*3/uL — ABNORMAL LOW (ref 4.0–10.5)

## 2013-05-12 SURGERY — REPAIR, RUPTURE, GLOBE
Anesthesia: General | Laterality: Left | Wound class: Clean

## 2013-05-12 MED ORDER — MORPHINE SULFATE 4 MG/ML IJ SOLN
4.0000 mg | INTRAMUSCULAR | Status: DC | PRN
  Administered 2013-05-12: 4 mg via INTRAVENOUS
  Filled 2013-05-12: qty 1

## 2013-05-12 MED ORDER — SUFENTANIL CITRATE 50 MCG/ML IV SOLN
INTRAVENOUS | Status: DC | PRN
  Administered 2013-05-12 (×2): 5 ug via INTRAVENOUS
  Administered 2013-05-12: 10 ug via INTRAVENOUS

## 2013-05-12 MED ORDER — MIDAZOLAM HCL 5 MG/5ML IJ SOLN
INTRAMUSCULAR | Status: DC | PRN
  Administered 2013-05-12: .5 mg via INTRAVENOUS

## 2013-05-12 MED ORDER — TOBRAMYCIN 0.3 % OP OINT
TOPICAL_OINTMENT | OPHTHALMIC | Status: DC | PRN
  Administered 2013-05-12: 1 via OPHTHALMIC

## 2013-05-12 MED ORDER — PROMETHAZINE HCL 25 MG/ML IJ SOLN: 6.2500 mg | INTRAMUSCULAR | Status: DC | PRN

## 2013-05-12 MED ORDER — TRIAMCINOLONE ACETONIDE 40 MG/ML IJ SUSP
INTRAMUSCULAR | Status: AC
  Filled 2013-05-12: qty 5

## 2013-05-12 MED ORDER — GENTAMICIN SULFATE 40 MG/ML IJ SOLN
INTRAMUSCULAR | Status: DC | PRN
  Administered 2013-05-12: .25 mg

## 2013-05-12 MED ORDER — EPINEPHRINE HCL 1 MG/ML IJ SOLN
INTRAMUSCULAR | Status: AC
  Filled 2013-05-12: qty 1

## 2013-05-12 MED ORDER — PROPOFOL 10 MG/ML IV BOLUS
INTRAVENOUS | Status: DC | PRN
  Administered 2013-05-12: 150 mg via INTRAVENOUS

## 2013-05-12 MED ORDER — ONDANSETRON HCL 4 MG/2ML IJ SOLN
4.0000 mg | Freq: Once | INTRAMUSCULAR | Status: AC
  Administered 2013-05-12: 4 mg via INTRAVENOUS

## 2013-05-12 MED ORDER — HYPROMELLOSE 2 % IO SOLN
INTRAOCULAR | Status: DC | PRN
  Administered 2013-05-12: 1 mL via INTRAOCULAR

## 2013-05-12 MED ORDER — HYPROMELLOSE (GONIOSCOPIC) 2.5 % OP SOLN
OPHTHALMIC | Status: AC
  Filled 2013-05-12: qty 15

## 2013-05-12 MED ORDER — VANCOMYCIN HCL 10 G IV SOLR
1500.0000 mg | Freq: Once | INTRAVENOUS | Status: AC
  Administered 2013-05-12: 1500 mg via INTRAVENOUS
  Filled 2013-05-12: qty 1500

## 2013-05-12 MED ORDER — LIDOCAINE HCL 2 % IJ SOLN
INTRAMUSCULAR | Status: AC
  Filled 2013-05-12: qty 20

## 2013-05-12 MED ORDER — DEXAMETHASONE SODIUM PHOSPHATE 10 MG/ML IJ SOLN
INTRAMUSCULAR | Status: DC | PRN
  Administered 2013-05-12: .2 mg

## 2013-05-12 MED ORDER — NEOSTIGMINE METHYLSULFATE 1 MG/ML IJ SOLN
INTRAMUSCULAR | Status: DC | PRN
  Administered 2013-05-12: 4 mg via INTRAVENOUS

## 2013-05-12 MED ORDER — BACITRACIN-POLYMYXIN B 500-10000 UNIT/GM OP OINT
TOPICAL_OINTMENT | OPHTHALMIC | Status: AC
  Filled 2013-05-12: qty 3.5

## 2013-05-12 MED ORDER — CEFTAZIDIME 1 G IJ SOLR: 1.0000 g | Freq: Once | INTRAMUSCULAR | Status: DC

## 2013-05-12 MED ORDER — DEXAMETHASONE SODIUM PHOSPHATE 10 MG/ML IJ SOLN
INTRAMUSCULAR | Status: AC
  Filled 2013-05-12: qty 1

## 2013-05-12 MED ORDER — GENTAMICIN SULFATE 40 MG/ML IJ SOLN
INTRAMUSCULAR | Status: AC
  Filled 2013-05-12: qty 2

## 2013-05-12 MED ORDER — POLYMYXIN B SULFATE 500000 UNITS IJ SOLR
INTRAMUSCULAR | Status: AC
  Filled 2013-05-12: qty 1

## 2013-05-12 MED ORDER — SODIUM HYALURONATE 10 MG/ML IO SOLN
INTRAOCULAR | Status: AC
  Filled 2013-05-12: qty 0.85

## 2013-05-12 MED ORDER — TOBRAMYCIN-DEXAMETHASONE 0.3-0.1 % OP OINT
TOPICAL_OINTMENT | OPHTHALMIC | Status: AC
  Filled 2013-05-12: qty 3.5

## 2013-05-12 MED ORDER — TIGAN 300 MG PO CAPS: 300.0000 mg | ORAL_CAPSULE | Freq: Three times a day (TID) | ORAL | Status: DC

## 2013-05-12 MED ORDER — TETANUS-DIPHTH-ACELL PERTUSSIS 5-2.5-18.5 LF-MCG/0.5 IM SUSP
0.5000 mL | Freq: Once | INTRAMUSCULAR | Status: AC
  Administered 2013-05-12: 0.5 mL via INTRAMUSCULAR
  Filled 2013-05-12: qty 0.5

## 2013-05-12 MED ORDER — METOCLOPRAMIDE HCL 5 MG/ML IJ SOLN
10.0000 mg | Freq: Once | INTRAMUSCULAR | Status: AC
  Administered 2013-05-12: 10 mg via INTRAVENOUS
  Filled 2013-05-12: qty 2

## 2013-05-12 MED ORDER — HYALURONIDASE HUMAN 150 UNIT/ML IJ SOLN
INTRAMUSCULAR | Status: AC
  Filled 2013-05-12: qty 1

## 2013-05-12 MED ORDER — ROCURONIUM BROMIDE 100 MG/10ML IV SOLN
INTRAVENOUS | Status: DC | PRN
  Administered 2013-05-12: 40 mg via INTRAVENOUS

## 2013-05-12 MED ORDER — LACTATED RINGERS IV SOLN
INTRAVENOUS | Status: DC | PRN
  Administered 2013-05-12 (×2): via INTRAVENOUS

## 2013-05-12 MED ORDER — BSS IO SOLN
INTRAOCULAR | Status: DC | PRN
  Administered 2013-05-12: 15 mL via INTRAOCULAR

## 2013-05-12 MED ORDER — ACETAZOLAMIDE SODIUM 500 MG IJ SOLR
INTRAMUSCULAR | Status: AC
  Filled 2013-05-12: qty 500

## 2013-05-12 MED ORDER — ATROPINE SULFATE 1 % OP SOLN
OPHTHALMIC | Status: AC
  Filled 2013-05-12: qty 2

## 2013-05-12 MED ORDER — MORPHINE SULFATE 2 MG/ML IJ SOLN: 1.0000 mg | INTRAMUSCULAR | Status: DC | PRN

## 2013-05-12 MED ORDER — TETRACAINE HCL 0.5 % OP SOLN
OPHTHALMIC | Status: AC
  Filled 2013-05-12: qty 2

## 2013-05-12 MED ORDER — ONDANSETRON HCL 4 MG/2ML IJ SOLN
INTRAMUSCULAR | Status: DC | PRN
  Administered 2013-05-12: 4 mg via INTRAVENOUS

## 2013-05-12 MED ORDER — HYDROMORPHONE HCL PF 1 MG/ML IJ SOLN
1.0000 mg | INTRAMUSCULAR | Status: DC | PRN
  Administered 2013-05-12: 1 mg via INTRAVENOUS
  Filled 2013-05-12: qty 1

## 2013-05-12 MED ORDER — LIDOCAINE HCL (CARDIAC) 20 MG/ML IV SOLN
INTRAVENOUS | Status: DC | PRN
  Administered 2013-05-12: 80 mg via INTRAVENOUS

## 2013-05-12 MED ORDER — HYDROMORPHONE HCL 4 MG PO TABS: 4.0000 mg | ORAL_TABLET | ORAL | Status: DC | PRN

## 2013-05-12 MED ORDER — ONDANSETRON HCL 4 MG/2ML IJ SOLN
4.0000 mg | Freq: Four times a day (QID) | INTRAMUSCULAR | Status: DC | PRN
  Administered 2013-05-12: 4 mg via INTRAVENOUS
  Filled 2013-05-12 (×2): qty 2

## 2013-05-12 MED ORDER — BUPIVACAINE HCL (PF) 0.75 % IJ SOLN
INTRAMUSCULAR | Status: AC
  Filled 2013-05-12: qty 10

## 2013-05-12 MED ORDER — GLYCOPYRROLATE 0.2 MG/ML IJ SOLN
INTRAMUSCULAR | Status: DC | PRN
  Administered 2013-05-12: .6 mg via INTRAVENOUS

## 2013-05-12 MED ORDER — EPHEDRINE SULFATE 50 MG/ML IJ SOLN
INTRAMUSCULAR | Status: DC | PRN
  Administered 2013-05-12: 10 mg via INTRAVENOUS
  Administered 2013-05-12 (×2): 5 mg via INTRAVENOUS

## 2013-05-12 MED ORDER — MINERAL OIL LIGHT 100 % EX OIL
TOPICAL_OIL | CUTANEOUS | Status: AC
  Filled 2013-05-12: qty 25

## 2013-05-12 MED ORDER — BSS IO SOLN
INTRAOCULAR | Status: AC
  Filled 2013-05-12: qty 15

## 2013-05-12 MED ORDER — PHENYLEPHRINE HCL 10 MG/ML IJ SOLN
INTRAMUSCULAR | Status: DC | PRN
  Administered 2013-05-12: 80 ug via INTRAVENOUS

## 2013-05-12 MED ORDER — DEXTROSE 5 % IV SOLN
1.0000 g | Freq: Once | INTRAVENOUS | Status: AC
  Administered 2013-05-12: 1 g via INTRAVENOUS
  Filled 2013-05-12: qty 1

## 2013-05-12 MED ORDER — DIPHENHYDRAMINE HCL 50 MG/ML IJ SOLN
12.5000 mg | Freq: Once | INTRAMUSCULAR | Status: AC
  Administered 2013-05-12: 12.5 mg via INTRAVENOUS
  Filled 2013-05-12: qty 1

## 2013-05-12 MED ORDER — NA CHONDROIT SULF-NA HYALURON 40-30 MG/ML IO SOLN
INTRAOCULAR | Status: AC
  Filled 2013-05-12: qty 0.5

## 2013-05-12 SURGICAL SUPPLY — 40 items
APL SRG 3 HI ABS STRL LF PLS (MISCELLANEOUS) ×1
APPLICATOR DR MATTHEWS STRL (MISCELLANEOUS) ×2 IMPLANT
CANNULA ANT CHAM MAIN (OPHTHALMIC RELATED) IMPLANT
CLOTH BEACON ORANGE TIMEOUT ST (SAFETY) ×2 IMPLANT
CORDS BIPOLAR (ELECTRODE) ×2 IMPLANT
CUTTER PEDIATRIC ACCURUS 2500 (OPHTHALMIC RELATED) ×1 IMPLANT
DRAPE PROXIMA HALF (DRAPES) ×1 IMPLANT
ERASER HMR WETFIELD 23G BP (MISCELLANEOUS) ×1 IMPLANT
GLOVE ECLIPSE 6.5 STRL STRAW (GLOVE) ×2 IMPLANT
GLOVE ECLIPSE 7.0 STRL STRAW (GLOVE) ×2 IMPLANT
GLOVE SURG SIGNA 7.5 PF LTX (GLOVE) ×2 IMPLANT
GOWN STRL NON-REIN LRG LVL3 (GOWN DISPOSABLE) ×4 IMPLANT
KIT ROOM TURNOVER OR (KITS) ×2 IMPLANT
MARKER SKIN DUAL TIP RULER LAB (MISCELLANEOUS) ×2 IMPLANT
MASK EYE SHIELD (GAUZE/BANDAGES/DRESSINGS) ×1 IMPLANT
NS IRRIG 1000ML POUR BTL (IV SOLUTION) ×2 IMPLANT
PACK CATARACT CUSTOM (CUSTOM PROCEDURE TRAY) ×2 IMPLANT
PACK VITRECTOMY PIC MCHSVP (PACKS) ×1 IMPLANT
PAD ARMBOARD 7.5X6 YLW CONV (MISCELLANEOUS) ×4 IMPLANT
PAD EYE OVAL STERILE LF (GAUZE/BANDAGES/DRESSINGS) ×1 IMPLANT
PAK VITRECTOMY PIK  23GA (OPHTHALMIC RELATED) ×1 IMPLANT
SPEAR EYE SURG WECK-CEL (MISCELLANEOUS) IMPLANT
STRIP CLOSURE SKIN 1/2X4 (GAUZE/BANDAGES/DRESSINGS) ×1 IMPLANT
SUT ETHILON 10 0 CS140 6 (SUTURE) ×1 IMPLANT
SUT ETHILON 8 0 TG100 8 (SUTURE) IMPLANT
SUT ETHILON 9 0 TG140 8 (SUTURE) ×3 IMPLANT
SUT MERSILENE 4 0 S 24 (SUTURE) IMPLANT
SUT SILK 4 0 RB 1 (SUTURE) IMPLANT
SUT VIC AB 4-0 P-3 18X BRD (SUTURE) IMPLANT
SUT VIC AB 4-0 P3 18 (SUTURE) ×2
SUT VICRYL 6 0 S 29 12 (SUTURE) ×1 IMPLANT
SUT VICRYL 7 0 TG140 8 (SUTURE) IMPLANT
SUT VICRYL 8 0 TG140 8 (SUTURE) ×1 IMPLANT
SUT VICRYL 9-0 (SUTURE) ×1 IMPLANT
SYR TB 1ML LUER SLIP (SYRINGE) ×1 IMPLANT
TAPE SURG TRANSPORE 1 IN (GAUZE/BANDAGES/DRESSINGS) IMPLANT
TAPE SURGICAL TRANSPORE 1 IN (GAUZE/BANDAGES/DRESSINGS) ×1
TOWEL OR 17X24 6PK STRL BLUE (TOWEL DISPOSABLE) ×5 IMPLANT
WATER STERILE IRR 1000ML POUR (IV SOLUTION) ×2 IMPLANT
WIPE INSTRUMENT VISIWIPE 73X73 (MISCELLANEOUS) IMPLANT

## 2013-05-12 NOTE — Anesthesia Postprocedure Evaluation (Signed)
  Anesthesia Post-op Note  Patient: Brent Phelps  Procedure(s) Performed: Procedure(s): REPAIR OF RUPTURED GLOBE WITH CLOSURE OF LACERATION LOWER EYELID (Left)  Patient Location: PACU  Anesthesia Type:General  Level of Consciousness: awake  Airway and Oxygen Therapy: Patient Spontanous Breathing  Post-op Pain: mild  Post-op Assessment: Post-op Vital signs reviewed  Post-op Vital Signs: stable  Complications: No apparent anesthesia complications

## 2013-05-12 NOTE — ED Notes (Signed)
Daughter at bedside -- [phone] Brent Phelps)

## 2013-05-12 NOTE — Anesthesia Procedure Notes (Signed)
Procedure Name: Intubation Date/Time: 05/12/2013 7:47 PM Performed by: Brien Mates DOBSON Pre-anesthesia Checklist: Patient identified, Emergency Drugs available, Suction available, Patient being monitored and Timeout performed Patient Re-evaluated:Patient Re-evaluated prior to inductionOxygen Delivery Method: Circle system utilized Preoxygenation: Pre-oxygenation with 100% oxygen Intubation Type: IV induction and Cricoid Pressure applied Ventilation: Mask ventilation without difficulty Laryngoscope Size: Miller and 2 Grade View: Grade I Tube type: Oral Tube size: 7.5 mm Number of attempts: 1 Airway Equipment and Method: Stylet Placement Confirmation: ETT inserted through vocal cords under direct vision,  positive ETCO2 and breath sounds checked- equal and bilateral Secured at: 22 cm Tube secured with: Tape Dental Injury: Teeth and Oropharynx as per pre-operative assessment

## 2013-05-12 NOTE — H&P (Signed)
Brent Phelps is an 67 y.o. male.   Chief Complaint: S/P Penetrating ocular trauma os Golf ball injury , acute loss of VA os with onset of ocular pain. HPI: 67 y/o golfer struck c golf ball in os c subsequent loss of VA and globe rupture c orbital fx.  Past Medical History  Diagnosis Date  . Erectile dysfunction   . Elevated prostate specific antigen (PSA)   . Prostate cancer   . Shortness of breath on exertion     Past Surgical History  Procedure Laterality Date  . Anal fissure repair  AGE 57  . Thoracic fusion  1992      RE-DO T9 - T10 (PRIOR DISKECTOMY IN 1991)  . Radioactive seed implant  06/20/2012    Procedure: RADIOACTIVE SEED IMPLANT;  Surgeon: Kathi Ludwig, MD;  Location: [facility];  Service: Urology;  Laterality: N/A;  83 seeds implanted  . Cystoscopy  06/20/2012    Procedure: CYSTOSCOPY FLEXIBLE;  Surgeon: Kathi Ludwig, MD;  Location: [facility];  Service: Urology;  Laterality: N/A;  no seeds found in bladder  . Cystoscopy  06/20/2012    Procedure: CYSTOSCOPY;  Surgeon: Kathi Ludwig, MD;  Location: [facility];  Service: Urology;;    Family History  Problem Relation Age of Onset  . Cancer Brother     prostate  . Cancer Sister     breast  . Cancer Brother     colon   Social History:  reports that he has been smoking Cigarettes.  He has a 12.5 pack-year smoking history. He has never used smokeless tobacco. He reports that he does not drink alcohol or use illicit drugs.  Allergies:  Allergies  Allergen Reactions  . Codeine Nausea And Vomiting    Medications Prior to Admission  Medication Sig Dispense Refill  . albuterol (PROVENTIL HFA;VENTOLIN HFA) 108 (90 BASE) MCG/ACT inhaler Inhale 1 puff into the lungs every 6 (six) hours as needed for wheezing.        Results for orders placed during the hospital encounter of 05/12/13 (from the past 48 hour(s))  CBC     Status: Abnormal   Collection  Time    05/12/13 12:52 PM      Result Value Range   WBC 3.8 (*) 4.0 - 10.5 K/uL   RBC 4.43  4.22 - 5.81 MIL/uL   Hemoglobin 13.8  13.0 - 17.0 g/dL   HCT 69.6  29.5 - 28.4 %   MCV 88.5  78.0 - 100.0 fL   MCH 31.2  26.0 - 34.0 pg   MCHC 35.2  30.0 - 36.0 g/dL   RDW 13.2  44.0 - 10.2 %   Platelets 143 (*) 150 - 400 K/uL  BASIC METABOLIC PANEL     Status: Abnormal   Collection Time    05/12/13 12:52 PM      Result Value Range   Sodium 136  135 - 145 mEq/L   Potassium 3.8  3.5 - 5.1 mEq/L   Chloride 102  96 - 112 mEq/L   CO2 23  19 - 32 mEq/L   Glucose, Bld 120 (*) 70 - 99 mg/dL   BUN 19  6 - 23 mg/dL   Creatinine, Ser 7.25  0.50 - 1.35 mg/dL   Calcium 9.4  8.4 - 36.6 mg/dL   GFR calc non Af Amer 87 (*) >90 mL/min   GFR calc Af Amer >90  >90 mL/min   Comment:  The eGFR has been calculated     using the CKD EPI equation.     This calculation has not been     validated in all clinical     situations.     eGFR's persistently     <90 mL/min signify     possible Chronic Kidney Disease.   Ct Head Wo Contrast  05/12/2013   *RADIOLOGY REPORT*  Clinical Data:  Hit in left eye with golf ball.  CT HEAD WITHOUT CONTRAST CT MAXILLOFACIAL WITHOUT CONTRAST  Technique:  Multidetector CT imaging of the head and maxillofacial structures were performed using the standard protocol without intravenous contrast. Multiplanar CT image reconstructions of the maxillofacial structures were also generated.  Comparison:   None.  CT HEAD  Findings: Mild atrophy.  Mild chronic microvascular ischemic change.  No acute stroke or hemorrhage.  No mass lesion or hydrocephalus.  Calvarium intact.  Clear mastoids.  IMPRESSION: Mild age related changes.  No acute intracranial findings.  CT MAXILLOFACIAL  Findings:   The anterior wall of the left maxillary sinus has been disrupted anteriorly, consistent with a direct below from a golf ball.  Fragments of bone have been driven   inward where there is a collection  of blood and air within the sinus.  The left orbital floor has also been disrupted,  and there is herniation of orbital fat and muscle downward into the maxillary sinus.  Orbital emphysema is present.  The left globe is abnormal.  There is suspected vitreous hemorrhage, lens displacement, and the integrity of the globe has likely been violated, as it has an abnormal ovoid rather than spherical shape.  The other paranasal sinuses are grossly clear.  There is no base of skull fracture.  The left zygoma is intact.  There may be a slight nondisplaced left nasal bone fracture.  IMPRESSION: Direct left anterior maxillary wall comminuted fracture with inward displacement of bony fragments.  Left orbital inferior blowout fracture, with orbital emphysema and herniation of orbital fat and muscle into the sinus.  Suspected left globe disruption with vitreous hemorrhage and lens displacement.  Urgent ophthalmologic consultation is indicated.  Critical Value/emergent results were called by telephone at the time of interpretation on 05/12/2013 at 3:15 p.m. to Ronna Polio, who verbally acknowledged these results.   Original Report Authenticated By: Davonna Belling, M.D.   Ct Maxillofacial Wo Cm  05/12/2013   *RADIOLOGY REPORT*  Clinical Data:  Hit in left eye with golf ball.  CT HEAD WITHOUT CONTRAST CT MAXILLOFACIAL WITHOUT CONTRAST  Technique:  Multidetector CT imaging of the head and maxillofacial structures were performed using the standard protocol without intravenous contrast. Multiplanar CT image reconstructions of the maxillofacial structures were also generated.  Comparison:   None.  CT HEAD  Findings: Mild atrophy.  Mild chronic microvascular ischemic change.  No acute stroke or hemorrhage.  No mass lesion or hydrocephalus.  Calvarium intact.  Clear mastoids.  IMPRESSION: Mild age related changes.  No acute intracranial findings.  CT MAXILLOFACIAL  Findings:   The anterior wall of the left maxillary sinus has been  disrupted anteriorly, consistent with a direct below from a golf ball.  Fragments of bone have been driven   inward where there is a collection of blood and air within the sinus.  The left orbital floor has also been disrupted,  and there is herniation of orbital fat and muscle downward into the maxillary sinus.  Orbital emphysema is present.  The left globe is abnormal.  There  is suspected vitreous hemorrhage, lens displacement, and the integrity of the globe has likely been violated, as it has an abnormal ovoid rather than spherical shape.  The other paranasal sinuses are grossly clear.  There is no base of skull fracture.  The left zygoma is intact.  There may be a slight nondisplaced left nasal bone fracture.  IMPRESSION: Direct left anterior maxillary wall comminuted fracture with inward displacement of bony fragments.  Left orbital inferior blowout fracture, with orbital emphysema and herniation of orbital fat and muscle into the sinus.  Suspected left globe disruption with vitreous hemorrhage and lens displacement.  Urgent ophthalmologic consultation is indicated.  Critical Value/emergent results were called by telephone at the time of interpretation on 05/12/2013 at 3:15 p.m. to Ronna Polio, who verbally acknowledged these results.   Original Report Authenticated By: Davonna Belling, M.D.    Review of Systems  Constitutional: Positive for malaise/fatigue.  Eyes: Positive for blurred vision, pain and redness.  Gastrointestinal: Positive for nausea.    Blood pressure 114/67, pulse 51, temperature 97.7 F (36.5 C), temperature source Oral, resp. rate 17, height 5\' 6"  (1.676 m), weight 70.308 kg (155 lb), SpO2 92.00%. Physical Exam  Constitutional: He appears well-developed. He appears distressed.  HENT:  Head: Normocephalic. Head is with laceration and with left periorbital erythema.  Eyes:       Assessment/Plan S/P high velocity projectile trauma (golf ball injury to os )  Possible  ruptured globe  Hyphema os Periocular laceration os.  PLan:  NPO  Globe exploration os  Closure of perforations evacuation of hyphema , closure of periocular lacerations under general anesthesia.  Mita Vallo A 05/12/2013, 6:09 PM

## 2013-05-12 NOTE — Transfer of Care (Signed)
Immediate Anesthesia Transfer of Care Note  Patient: Brent Phelps  Procedure(s) Performed: Procedure(s): REPAIR OF RUPTURED GLOBE WITH CLOSURE OF LACERATION LOWER EYELID (Left)  Patient Location: PACU  Anesthesia Type:General  Level of Consciousness: awake, alert  and oriented  Airway & Oxygen Therapy: Patient Spontanous Breathing and Patient connected to nasal cannula oxygen  Post-op Assessment: Report given to PACU RN and Post -op Vital signs reviewed and stable  Post vital signs: Reviewed and stable  Complications: No apparent anesthesia complications

## 2013-05-12 NOTE — ED Notes (Signed)
Transferred to OR.

## 2013-05-12 NOTE — Preoperative (Signed)
Beta Blockers   Reason not to administer Beta Blockers:Not Applicable 

## 2013-05-12 NOTE — Anesthesia Preprocedure Evaluation (Addendum)
Anesthesia Evaluation  Patient identified by MRN, date of birth, ID band Patient awake    Reviewed: Allergy & Precautions, NPO status , Patient's Chart, lab work & pertinent test results  Airway Mallampati: II  Neck ROM: Full    Dental  (+) Teeth Intact and Dental Advisory Given   Pulmonary shortness of breath, Current Smoker,  breath sounds clear to auscultation        Cardiovascular negative cardio ROS  Rhythm:Regular Rate:Normal     Neuro/Psych    GI/Hepatic negative GI ROS, Neg liver ROS,   Endo/Other  negative endocrine ROS  Renal/GU negative Renal ROS     Musculoskeletal   Abdominal   Peds  Hematology negative hematology ROS (+)   Anesthesia Other Findings   Reproductive/Obstetrics                           Anesthesia Physical Anesthesia Plan  ASA: II and emergent  Anesthesia Plan: General   Post-op Pain Management:    Induction: Intravenous  Airway Management Planned: Oral ETT  Additional Equipment:   Intra-op Plan:   Post-operative Plan: Extubation in OR  Informed Consent: I have reviewed the patients History and Physical, chart, labs and discussed the procedure including the risks, benefits and alternatives for the proposed anesthesia with the patient or authorized representative who has indicated his/her understanding and acceptance.   Dental advisory given  Plan Discussed with: CRNA, Surgeon and Anesthesiologist  Anesthesia Plan Comments:         Anesthesia Quick Evaluation

## 2013-05-12 NOTE — ED Provider Notes (Signed)
History    CSN: [id number] Arrival date & time 05/12/13  1210  First MD Initiated Contact with Patient 05/12/13 1250     Chief Complaint  Patient presents with  . Eye Injury   (Consider location/radiation/quality/duration/timing/severity/associated sxs/prior Treatment) HPI Comments: TIME OF ENCOUNTER: 05/12/2013 2:56 PM. Nursing triage note reviewed.  PCP: [name], MD  CHIEF COMPLAINT: Eye Injury   HISTORY OF PRESENT ILLNESS: Brent Phelps is a 67 y.o. male who presents with c/o left eye pain after an injury sustained by a golf ball hitting his eye just PTA.   The pain is localized and throbbing.  He reports feeling like he was going to pass out just after the injury, and decreased vision in his left eye.  He denies any other injury, LOC, change in mental status.  He denies previous eye injury.  He reports some nausea since the injury.     PAST MEDICAL HISTORY: Patient Active Problem List   Prostate ca        Date Noted: 02/28/2012   Past Medical History:   Erectile dysfunction                                         Elevated prostate specific antigen (PSA)                     Prostate cancer                                              Shortness of breath on exertion                             Nurses notes read and reviewed and agree unless otherwise noted.  PAST SURGICAL HISTORY: Past Surgical History:   ANAL FISSURE REPAIR                              AGE 47       THORACIC FUSION                                  1992           Comment:RE-DO T9 - T10 (PRIOR DISKECTOMY IN 1991)   RADIOACTIVE SEED IMPLANT                         06/20/2012      Comment:Procedure: RADIOACTIVE SEED IMPLANT;  Surgeon:               Kathi Ludwig, MD;  Location: [facility];  Service: Urology;                Laterality: N/A;  83 seeds implanted   CYSTOSCOPY                                       06/20/2012      Comment:Procedure: CYSTOSCOPY  FLEXIBLE;  Surgeon:               Kathi Ludwig, MD;  Location: [facility];  Service: Urology;                Laterality: N/A;  no seeds found in bladder   CYSTOSCOPY                                       06/20/2012      Comment:Procedure: CYSTOSCOPY;  Surgeon: Kathi Ludwig, MD;  Location: [facility];  Service: Urology;;   SOCIAL HISTORY Patient denies any significant drug or alcohol abuse   Smoking Status: Current Every Day Smoker        Packs/Day: 0.25  Years: 50        Types: Cigarettes   Smokeless Status: Never Used                       Comment: PT CUT DOWN FROM 1 1/2 PPD TO 6 CIG DAILY    Alcohol Use: No              Drug Use: No             FAMILY HISTORY: Reviewed and felt to be non contributory to history of present illness Review of patient's family history indicates:   Cancer                         Brother                    Comment: prostate   Cancer                         Sister                     Comment: breast   Cancer                         Brother                    Comment: colon    ALLERGIES:  -- Codeine -- Nausea And Vomiting  REVIEW OF SYSTEMS:   Constitutional  No fever, chills,significant change in weight Integumentary  Skin lac to left cheek Eyes See HPI Ears/Nose/Throat EARS: DENIES:  Pain or discharge NOSE: DENIES: nosebleeds or obstruction THROAT: DENIES: airway obstruction,  trouble swallowing Cardiovascular  No chest pain, palpitations, shortness of breath when lying flat Respiratory   No shortness of breath, cough, or wheezing Gastrointestinal  No nausea, vomiting, diarrhea, abdominal pain,  black or bloody stools Genitourinary   No urinary frequency, urgency, or dysuria Musculoskeletal  No significant neck or back pain reported Endocrine  No significant change in weight or cold intolerance reported Hematologic/Lymphatic  No significant  swollen glands or easy bruising reported Neurological See HPI Allergic/Immunologic  No specific immunologic complaint Psychiatric  No specific suicidal or homicidal thoughts reported    All other review of systems is otherwise negative except as  above and as described in the HPI.  PHYSICAL EXAM CONSTITUTIONAL  well developed, well nourished, alert, non toxic appearing; APPEARS IN PAIN HEENT: left eye appears to have obvious trauma.  Pupil unable to be visualized. C/f hyphema.  Periorbital region shows obvious trauma and superficial laceration to suborbital region. EOMI.  OP clear.  EYES  See above NECK  normal ROM, supple, no JVD, trachea normal, no mass CARDIOVASCULAR  Distant heart sounds, no signifcant murmur noted, full and effective pulses PULMONARY/CHEST WALL  normal effort, no respiratory distress, BS normal, no wheezes, no rhonchi, no rales ABDOMINAL  normal appearance, no distension, no mass, no pulsatile mass; soft, non tender, no rigidity, guarding or rebound MUSCULOSKELETAL  Normal range of motion of extremities;  no midline cervical, thoracic, or lumbar spine tenderness, no peripheral edema, no calf tenderness or cords NEUROLOGICAL  patient is awake and responsive;  no significant motor or sensory deficit noted  LYMPHATIC  no cervical or other adenopathy appreciated SKIN  warm, dry, intact, no rash PSYCHIATRIC  No suicidal or homicidal ideations;  Normal affect and expected cognition  DIAGNOSTIC STUDIES      ED PROVIDER INTERPRETATION OF DATA: Laboratory and radiology tests have been ordered with results reviewed, interpreted and considered in the medical decision making process.       RADIOLOGY INTERPRETATION:  CT HEAD WITHOUT CONTRAST CT MAXILLOFACIAL WITHOUT CONTRAST  Technique:  Multidetector CT imaging of the head and maxillofacial structures were performed using the standard protocol without intravenous contrast. Multiplanar CT image reconstructions of  the maxillofacial structures were also generated.  Comparison:   None.  CT HEAD  Findings: Mild atrophy.  Mild chronic microvascular ischemic change.  No acute stroke or hemorrhage.  No mass lesion or hydrocephalus.  Calvarium intact.  Clear mastoids.  IMPRESSION: Mild age related changes.  No acute intracranial findings.  CT MAXILLOFACIAL  Findings:   The anterior wall of the left maxillary sinus has been disrupted anteriorly, consistent with a direct below from a golf ball.  Fragments of bone have been driven   inward where there is a collection of blood and air within the sinus.  The left orbital floor has also been disrupted,  and there is herniation of orbital fat and muscle downward into the maxillary sinus.  Orbital emphysema is present.  The left globe is abnormal.  There is suspected vitreous hemorrhage, lens displacement, and the integrity of the globe has likely been violated, as it has an abnormal ovoid rather than spherical shape.  The other paranasal sinuses are grossly clear.  There is no base of skull fracture.  The left zygoma is intact.  There may be a slight nondisplaced left nasal bone fracture.  IMPRESSION: Direct left anterior maxillary wall comminuted fracture with inward displacement of bony fragments.  Left orbital inferior blowout fracture, with orbital emphysema and herniation of orbital fat and muscle into the sinus.  Suspected left globe disruption with vitreous hemorrhage and lens displacement.  Urgent ophthalmologic consultation is indicated.  Critical Value/emergent results were called by telephone at the time of interpretation on 05/12/2013 at 3:15 p.m. to Ronna Polio, who verbally acknowledged these results.         CRITICAL CARE TIME: Critical care time was provided for 45 minutes by the attending physician, exclusive of separately billable procedures and treating other patients.   My critical care time and attention to the  patient's condition included, but was not limited to, one or more of the following: chart data review,  reviewing RN notes, reviewing old charts, documentation time, consultation and collaboration on findings or treatment, medication orders and management, transfer of care plans, vital signs assessments, repeated patient evaluations, ordering/interpreting/reviewing diagnostic studies, and obtaining necessary history and information pertaining to medical care from others.  This was necessary to treat or prevent further deterioration of the following condition(s): hyphema and orbital/maxillary sinus fractures which the patient had and/or had a high probability of suddenly developing.     MEDICAL DECISION MAKING:  Nursing notes read, reviewed.  1515: spoke with rads regarding eye findings.  repaged ophtho. 1600 HRS: spoke with ophtho who states they will come see patient.  Family and patient updated      Condition: unchanged, Pt well appearing at disposition time.         DIAGNOSIS  (acute) @[facility] @  Brent Phelps, ANN   Note: Portions of this report may have been transcribed using voice recognition software. Every effort was made to ensure accuracy; however, inadvertent computerized transcription errors may be present   Patient is a 67 y.o. male presenting with eye injury.  Eye Injury   Past Medical History  Diagnosis Date  . Erectile dysfunction   . Elevated prostate specific antigen (PSA)   . Prostate cancer   . Shortness of breath on exertion    Past Surgical History  Procedure Laterality Date  . Anal fissure repair  AGE 47  . Thoracic fusion  1992      RE-DO T9 - T10 (PRIOR DISKECTOMY IN 1991)  . Radioactive seed implant  06/20/2012    Procedure: RADIOACTIVE SEED IMPLANT;  Surgeon: Kathi Ludwig, MD;  Location: [facility];  Service: Urology;  Laterality: N/A;  83 seeds implanted  . Cystoscopy  06/20/2012    Procedure: CYSTOSCOPY FLEXIBLE;   Surgeon: Kathi Ludwig, MD;  Location: [facility];  Service: Urology;  Laterality: N/A;  no seeds found in bladder  . Cystoscopy  06/20/2012    Procedure: CYSTOSCOPY;  Surgeon: Kathi Ludwig, MD;  Location: [facility];  Service: Urology;;   Family History  Problem Relation Age of Onset  . Cancer Brother     prostate  . Cancer Sister     breast  . Cancer Brother     colon   History  Substance Use Topics  . Smoking status: Current Every Day Smoker -- 0.25 packs/day for 50 years    Types: Cigarettes  . Smokeless tobacco: Never Used     Comment: PT CUT DOWN FROM 1 1/2 PPD TO 6 CIG DAILY  . Alcohol Use: No    Review of Systems  Allergies  Codeine  Home Medications   Current Outpatient Rx  Name  Route  Sig  Dispense  Refill  . albuterol (PROVENTIL HFA;VENTOLIN HFA) 108 (90 BASE) MCG/ACT inhaler   Inhalation   Inhale 1 puff into the lungs every 6 (six) hours as needed for wheezing.          BP 128/78  Pulse 66  Temp(Src) 97.7 F (36.5 C) (Oral)  Resp 13  Ht 5\' 6"  (1.676 m)  Wt 155 lb (70.308 kg)  BMI 25.03 kg/m2  SpO2 95% Physical Exam  ED Course  Procedures (including critical care time) Labs Reviewed  CBC - Abnormal; Notable for the following:    WBC 3.8 (*)    Platelets 143 (*)    All other components within normal limits  BASIC METABOLIC PANEL - Abnormal; Notable for the following:  Glucose, Bld 120 (*)    GFR calc non Af Amer 87 (*)    All other components within normal limits   No results found. No diagnosis found.  MDM  Pt with trauma to left eye c/f vitreous hemorrhage and multiple facial fractures.  IV antiemetics and pain meds given.  HOB at 30.  TDaP given.  Ophtho paged and requesting imaging prior to further management.  CT scans as noted above.  Spoke with Dr. Karleen Hampshire who will see pt in ED.  IV antibiotics ordered.  Family and patient updated regarding plan  Ashby Dawes, MD 05/12/13  [phone]

## 2013-05-12 NOTE — Brief Op Note (Signed)
05/12/2013  9:55 PM  PATIENT:  Brent Phelps  67 y.o. male  PRE-OPERATIVE DIAGNOSIS:  Ruptured Globe Left with laceration to left lower eyelid  POST-OPERATIVE DIAGNOSIS:  SAME   PROCEDURE:  Procedure(s): REPAIR OF RUPTURED GLOBE WITH CLOSURE OF LACERATION LOWER EYELID (Left)  SURGEON:  Surgeon(s) and Role:    * Corinda Gubler, MD - Primary  PHYSICIAN ASSISTANT:   ASSISTANTS: none   ANESTHESIA:   none  EBL:  Total I/O In: 1500 [I.V.:1500] Out: -   BLOOD ADMINISTERED:none  DRAINS: none   LOCAL MEDICATIONS USED:  NONE  SPECIMEN:  No Specimen  DISPOSITION OF SPECIMEN:  N/A  COUNTS:  YES  TOURNIQUET:  * No tourniquets in log *  DICTATION: .Other Dictation: Dictation Number [id number]  PLAN OF CARE: Discharge to home after PACU  PATIENT DISPOSITION:  PACU - hemodynamically stable.   Delay start of Pharmacological VTE agent (>24hrs) due to surgical blood loss or risk of bleeding: no

## 2013-05-12 NOTE — ED Notes (Signed)
Pt got hit with golf ball in his left eye.  Pt is unable to open left eye and has one inch bleeding lac distal to left eye.   No LOC.  No bloodthinners

## 2013-05-13 ENCOUNTER — Encounter (HOSPITAL_COMMUNITY): Payer: Self-pay | Admitting: Ophthalmology

## 2013-05-13 NOTE — Op Note (Signed)
NAME:  Brent Phelps, Brent Phelps NO.:  [id number]  MEDICAL RECORD NO.:  [id number]  LOCATION:  [facility]                         FACILITY:  [facility]  PHYSICIAN:  Casimiro Needle A. Karleen Hampshire, M.D.DATE OF BIRTH:  1946-02-14  DATE OF PROCEDURE:  05/12/2013 DATE OF DISCHARGE:  05/12/2013                              OPERATIVE REPORT   PREOPERATIVE DIAGNOSIS:  Status post traumatic orbital fracture, and  Left globe rupture.   POSTOPERATIVE DIAGNOSIS:  Status post exploration of left globe, closure of globe- corneoscleral  Lacerations, conjunctival closure and  closure of infraorbital laceration.  PROCEDURE:  Exploration of left globe, closure of globe lacerations, and closure of superficial infraorbital laceration of the face.  SURGEON:  Corinda Gubler, M.D.  ANESTHESIA:  General with endotracheal intubation.  INDICATIONS FOR PROCEDURE:  Brent Phelps is a 67 year old white male, who was reportedly playing golf when he was stuck by a golf ball in the left face resulting in a blowout fracture of the left orbit, collapse of the left orbital floor, and traumatic rupture of the left globe.  This procedure is indicated to explore the extent of the injury to close the globe and also to close the lacerations.  The risks and benefits of the procedure were explained to the patient prior to the procedure. Informed consent was obtained.  DESCRIPTION OF TECHNIQUE:  The patient was taken into the operating room, placed in the supine position.  The entire face was prepped and draped in usual sterile fashion.  After induction of general anesthesia established by endotracheal intubation, the left eye and adnexa were examined under the operating microscope.  It was found that the patient had a rupture at the inferoonasal border of the eye extending superiorly  from the 6 o'clock position all the way down to the 1 o'clock Position proximal to the limbus and extending posteriorly further to the  insertion of the superior  rectus tendon.  The intraocular contents with uveal and vitreous tissue  was prolapsing through the open wound in addition to copious bleeding.  The eye was filled with blood and no visible, discernible, normal architectural structures were able to be identified.  The prolapsed uveal tissue was then truncated and hemostasis was achieved with bipolar cautery.  The residual uveal  tissue was then repositioned through the wound and the wound was then summarily  closed with interrupted 9-0 nylon sutures extending from the inferior border to the superior limbus and then further posteriorly to the level of insertion of the superior rectus tendon.  Once the wound was closed, it was tested for security and once secured and watertight , the conjunctiva was then closed over the wound using interrupted 9-0 Vicryl sutures.  At the conclusion of the wound closure and exploration, injections of 10 mg of gentamicin and 2 mg of dexamethasone were given subconjunctivally.  Next, my attention was directed to closing the infraorbital laceration, it was found that the laceration was extensive extending through the skin of orbicularis and involving the rupture of the posterior floor of the orbit.  This laceration was closed with 2 interrupted deep 4-0 Vicryl sutures, which were placed deep through the orbicularis and then the skin and subcutaneous  layer were closed with interrupted 6-0 Vicryl suture.  At the conclusion of procedure, TobraDex ointment was instilled into fornices of the left eye and double pressure patch was applied with a Fox shield.  There were no apparent complications.     Casimiro Needle A. Karleen Hampshire, M.D.     MAS/MEDQ  D:  05/12/2013  T:  05/13/2013  Job:  [id number]

## 2013-05-19 ENCOUNTER — Encounter (HOSPITAL_COMMUNITY): Payer: Self-pay | Admitting: Pharmacy Technician

## 2013-05-20 ENCOUNTER — Other Ambulatory Visit: Payer: Self-pay | Admitting: Ophthalmology

## 2013-05-21 ENCOUNTER — Encounter (HOSPITAL_COMMUNITY)
Admission: RE | Admit: 2013-05-21 | Discharge: 2013-05-21 | Disposition: A | Discharge status: Home / Self Care | Payer: Medicare Other | Source: Ambulatory Visit | Attending: Ophthalmology | Admitting: Ophthalmology

## 2013-05-21 ENCOUNTER — Encounter (HOSPITAL_COMMUNITY): Payer: Self-pay

## 2013-05-21 ENCOUNTER — Ambulatory Visit (HOSPITAL_COMMUNITY)
Admission: RE | Admit: 2013-05-21 | Discharge: 2013-05-21 | Disposition: A | Discharge status: Home / Self Care | Payer: Medicare Other | Source: Ambulatory Visit | Attending: Anesthesiology | Admitting: Anesthesiology

## 2013-05-21 DIAGNOSIS — Z01812 Encounter for preprocedural laboratory examination: Secondary | ICD-10-CM | POA: Insufficient documentation

## 2013-05-21 DIAGNOSIS — Z87891 Personal history of nicotine dependence: Secondary | ICD-10-CM | POA: Insufficient documentation

## 2013-05-21 DIAGNOSIS — H3320 Serous retinal detachment, unspecified eye: Secondary | ICD-10-CM | POA: Insufficient documentation

## 2013-05-21 DIAGNOSIS — H4310 Vitreous hemorrhage, unspecified eye: Secondary | ICD-10-CM | POA: Insufficient documentation

## 2013-05-21 DIAGNOSIS — Z01810 Encounter for preprocedural cardiovascular examination: Secondary | ICD-10-CM | POA: Insufficient documentation

## 2013-05-21 LAB — BASIC METABOLIC PANEL
Chloride: 100 mEq/L (ref 96–112)
GFR calc Af Amer: >90 mL/min (ref >90)
GFR calc non Af Amer: 89 mL/min — ABNORMAL LOW (ref >90)
Potassium: 3.7 mEq/L (ref 3.5–5.1)

## 2013-05-21 LAB — CBC
HCT: 39.3 % (ref 39.0–52.0)
Hemoglobin: 13.7 g/dL (ref 13.0–17.0)
MCHC: 34.9 g/dL (ref 30.0–36.0)
RDW: 13.1 % (ref 11.5–15.5)
WBC: 5.1 10*3/uL (ref 4.0–10.5)

## 2013-05-21 MED ORDER — TROPICAMIDE 1 % OP SOLN
1.0000 [drp] | OPHTHALMIC | Status: AC | PRN
  Administered 2013-05-22 (×3): 1 [drp] via OPHTHALMIC
  Filled 2013-05-21: qty 3

## 2013-05-21 MED ORDER — CYCLOPENTOLATE HCL 1 % OP SOLN
1.0000 [drp] | Freq: Once | OPHTHALMIC | Status: AC
  Administered 2013-05-22: 1 [drp] via OPHTHALMIC
  Filled 2013-05-21: qty 2

## 2013-05-21 MED ORDER — GATIFLOXACIN 0.5 % OP SOLN
1.0000 [drp] | Freq: Four times a day (QID) | OPHTHALMIC | Status: AC
  Administered 2013-05-22 (×3): 1 [drp] via OPHTHALMIC
  Filled 2013-05-21: qty 2.5

## 2013-05-21 MED ORDER — PHENYLEPHRINE HCL 2.5 % OP SOLN: 1.0000 [drp] | Freq: Once | OPHTHALMIC | Status: AC

## 2013-05-21 NOTE — Pre-Procedure Instructions (Addendum)
DAILAN PFALZGRAF  05/21/2013   Your procedure is scheduled on:05/22/13  Report to [facility] at 530 AM.  Call this number if you have problems the morning of surgery: [phone]   Remember:   Do not eat food or drink liquids after midnight.   Take these medicines the morning of surgery with A SIP OF WATER: pain med, if needed, eye drops   Do not wear jewelry, make-up or nail polish.  Do not wear lotions, powders, or perfumes. You may wear deodorant.  Do not shave 48 hours prior to surgery. Men may shave face and neck.  Do not bring valuables to the hospital.  [facility] is not responsible                   for any belongings or valuables.  Contacts, dentures or bridgework may not be worn into surgery.  Leave suitcase in the car. After surgery it may be brought to your room.  For patients admitted to the hospital, checkout time is 11:00 AM the day of  discharge.   Patients discharged the day of surgery will not be allowed to drive  home.  Name and phone number of your driver:   Special Instructions: Shower using CHG 2 nights before surgery and the night before surgery.  If you shower the day of surgery use CHG.  Use special wash - you have one bottle of CHG for all showers.  You should use approximately 1/3 of the bottle for each shower.   Please read over the following fact sheets that you were given: Pain Booklet, Coughing and Deep Breathing and Surgical Site Infection Prevention

## 2013-05-22 ENCOUNTER — Encounter (HOSPITAL_COMMUNITY)
Admission: RE | Disposition: A | Discharge status: Home / Self Care | Payer: Self-pay | Source: Ambulatory Visit | Attending: Ophthalmology

## 2013-05-22 ENCOUNTER — Encounter (HOSPITAL_COMMUNITY): Payer: Self-pay | Admitting: Anesthesiology

## 2013-05-22 ENCOUNTER — Ambulatory Visit (HOSPITAL_COMMUNITY)
Admission: RE | Admit: 2013-05-22 | Discharge: 2013-05-22 | Disposition: A | Discharge status: Home / Self Care | Payer: Medicare Other | Source: Ambulatory Visit | Attending: Ophthalmology | Admitting: Ophthalmology

## 2013-05-22 ENCOUNTER — Ambulatory Visit (HOSPITAL_COMMUNITY): Payer: Medicare Other | Admitting: Anesthesiology

## 2013-05-22 DIAGNOSIS — F17200 Nicotine dependence, unspecified, uncomplicated: Secondary | ICD-10-CM | POA: Insufficient documentation

## 2013-05-22 DIAGNOSIS — Z79899 Other long term (current) drug therapy: Secondary | ICD-10-CM | POA: Insufficient documentation

## 2013-05-22 DIAGNOSIS — S0510XA Contusion of eyeball and orbital tissues, unspecified eye, initial encounter: Secondary | ICD-10-CM | POA: Insufficient documentation

## 2013-05-22 DIAGNOSIS — H3342 Traction detachment of retina, left eye: Secondary | ICD-10-CM

## 2013-05-22 DIAGNOSIS — H3320 Serous retinal detachment, unspecified eye: Secondary | ICD-10-CM | POA: Insufficient documentation

## 2013-05-22 DIAGNOSIS — N529 Male erectile dysfunction, unspecified: Secondary | ICD-10-CM | POA: Insufficient documentation

## 2013-05-22 DIAGNOSIS — Y929 Unspecified place or not applicable: Secondary | ICD-10-CM | POA: Insufficient documentation

## 2013-05-22 DIAGNOSIS — W219XXA Striking against or struck by unspecified sports equipment, initial encounter: Secondary | ICD-10-CM | POA: Insufficient documentation

## 2013-05-22 DIAGNOSIS — C61 Malignant neoplasm of prostate: Secondary | ICD-10-CM | POA: Insufficient documentation

## 2013-05-22 DIAGNOSIS — H4310 Vitreous hemorrhage, unspecified eye: Secondary | ICD-10-CM | POA: Insufficient documentation

## 2013-05-22 DIAGNOSIS — Y9353 Activity, golf: Secondary | ICD-10-CM | POA: Insufficient documentation

## 2013-05-22 DIAGNOSIS — S0530XA Ocular laceration without prolapse or loss of intraocular tissue, unspecified eye, initial encounter: Secondary | ICD-10-CM | POA: Insufficient documentation

## 2013-05-22 DIAGNOSIS — W208XXA Other cause of strike by thrown, projected or falling object, initial encounter: Secondary | ICD-10-CM | POA: Insufficient documentation

## 2013-05-22 HISTORY — PX: INJECTION OF SILICONE OIL: SHX6422

## 2013-05-22 HISTORY — PX: PARS PLANA VITRECTOMY: SHX2166

## 2013-05-22 HISTORY — PX: PHOTOCOAGULATION WITH LASER: SHX6027

## 2013-05-22 SURGERY — PARS PLANA VITRECTOMY WITH 23 GAUGE
Anesthesia: General | Laterality: Left

## 2013-05-22 SURGERY — PARS PLANA VITRECTOMY WITH 20 GAUGE
Anesthesia: General | Site: Eye | Laterality: Left | Wound class: Clean

## 2013-05-22 MED ORDER — LACTATED RINGERS IV SOLN
INTRAVENOUS | Status: DC | PRN
  Administered 2013-05-22: 07:00:00 via INTRAVENOUS

## 2013-05-22 MED ORDER — DEXAMETHASONE SODIUM PHOSPHATE 10 MG/ML IJ SOLN
INTRAMUSCULAR | Status: AC
  Filled 2013-05-22: qty 1

## 2013-05-22 MED ORDER — HYPROMELLOSE (GONIOSCOPIC) 2.5 % OP SOLN
OPHTHALMIC | Status: AC
  Filled 2013-05-22: qty 15

## 2013-05-22 MED ORDER — NEOSTIGMINE METHYLSULFATE 1 MG/ML IJ SOLN
INTRAMUSCULAR | Status: DC | PRN
  Administered 2013-05-22: 3 mg via INTRAVENOUS

## 2013-05-22 MED ORDER — NA CHONDROIT SULF-NA HYALURON 40-30 MG/ML IO SOLN
INTRAOCULAR | Status: DC | PRN
  Administered 2013-05-22 (×2): 0.5 mL via INTRAOCULAR

## 2013-05-22 MED ORDER — LIDOCAINE HCL 2 % IJ SOLN
INTRAMUSCULAR | Status: AC
  Filled 2013-05-22: qty 20

## 2013-05-22 MED ORDER — PROPOFOL 10 MG/ML IV BOLUS
INTRAVENOUS | Status: DC | PRN
  Administered 2013-05-22: 200 mg via INTRAVENOUS

## 2013-05-22 MED ORDER — LIDOCAINE HCL 2 % IJ SOLN
INTRAMUSCULAR | Status: DC | PRN
  Administered 2013-05-22: 1 mL

## 2013-05-22 MED ORDER — NA CHONDROIT SULF-NA HYALURON 40-30 MG/ML IO SOLN
INTRAOCULAR | Status: AC
  Filled 2013-05-22: qty 0.5

## 2013-05-22 MED ORDER — FENTANYL CITRATE 0.05 MG/ML IJ SOLN
INTRAMUSCULAR | Status: DC | PRN
  Administered 2013-05-22: 50 ug via INTRAVENOUS
  Administered 2013-05-22: 100 ug via INTRAVENOUS

## 2013-05-22 MED ORDER — GLYCOPYRROLATE 0.2 MG/ML IJ SOLN
INTRAMUSCULAR | Status: DC | PRN
  Administered 2013-05-22: .6 mg via INTRAVENOUS

## 2013-05-22 MED ORDER — HYDROMORPHONE HCL PF 1 MG/ML IJ SOLN
0.2500 mg | INTRAMUSCULAR | Status: DC | PRN
  Administered 2013-05-22 (×3): 0.5 mg via INTRAVENOUS

## 2013-05-22 MED ORDER — EPINEPHRINE HCL 1 MG/ML IJ SOLN
INTRAMUSCULAR | Status: AC
  Filled 2013-05-22: qty 1

## 2013-05-22 MED ORDER — POLYMYXIN B SULFATE 500000 UNITS IJ SOLR
INTRAMUSCULAR | Status: AC
  Filled 2013-05-22: qty 1

## 2013-05-22 MED ORDER — DEXAMETHASONE SODIUM PHOSPHATE 10 MG/ML IJ SOLN
INTRAMUSCULAR | Status: DC | PRN
  Administered 2013-05-22: 10 mg

## 2013-05-22 MED ORDER — HYPROMELLOSE (GONIOSCOPIC) 2.5 % OP SOLN
OPHTHALMIC | Status: DC | PRN
  Administered 2013-05-22: 2 [drp] via OPHTHALMIC

## 2013-05-22 MED ORDER — HYDROMORPHONE HCL PF 1 MG/ML IJ SOLN
INTRAMUSCULAR | Status: AC
  Filled 2013-05-22: qty 1

## 2013-05-22 MED ORDER — TETRACAINE HCL 0.5 % OP SOLN
OPHTHALMIC | Status: AC
  Filled 2013-05-22: qty 2

## 2013-05-22 MED ORDER — LIDOCAINE HCL (CARDIAC) 20 MG/ML IV SOLN
INTRAVENOUS | Status: DC | PRN
  Administered 2013-05-22: 50 mg via INTRAVENOUS

## 2013-05-22 MED ORDER — SODIUM HYALURONATE 10 MG/ML IO SOLN
INTRAOCULAR | Status: DC | PRN
  Administered 2013-05-22: 0.85 mL via INTRAOCULAR

## 2013-05-22 MED ORDER — PROMETHAZINE HCL 25 MG/ML IJ SOLN: 6.2500 mg | INTRAMUSCULAR | Status: DC | PRN

## 2013-05-22 MED ORDER — ONDANSETRON HCL 4 MG/2ML IJ SOLN
INTRAMUSCULAR | Status: DC | PRN
  Administered 2013-05-22: 4 mg via INTRAVENOUS

## 2013-05-22 MED ORDER — PHENYLEPHRINE HCL 2.5 % OP SOLN
OPHTHALMIC | Status: AC
  Administered 2013-05-22: 1 [drp] via OPHTHALMIC
  Filled 2013-05-22: qty 2

## 2013-05-22 MED ORDER — SODIUM HYALURONATE 10 MG/ML IO SOLN
INTRAOCULAR | Status: AC
  Filled 2013-05-22: qty 0.85

## 2013-05-22 MED ORDER — EPINEPHRINE HCL 1 MG/ML IJ SOLN
INTRAOCULAR | Status: DC | PRN
  Administered 2013-05-22: 08:00:00

## 2013-05-22 MED ORDER — ROCURONIUM BROMIDE 100 MG/10ML IV SOLN
INTRAVENOUS | Status: DC | PRN
  Administered 2013-05-22: 10 mg via INTRAVENOUS
  Administered 2013-05-22: 40 mg via INTRAVENOUS

## 2013-05-22 MED ORDER — GENTAMICIN SULFATE 40 MG/ML IJ SOLN
INTRAMUSCULAR | Status: AC
  Filled 2013-05-22: qty 2

## 2013-05-22 MED ORDER — 0.9 % SODIUM CHLORIDE (POUR BTL) OPTIME
TOPICAL | Status: DC | PRN
  Administered 2013-05-22: 1000 mL

## 2013-05-22 MED ORDER — CEFAZOLIN SODIUM 1-5 GM-% IV SOLN
INTRAVENOUS | Status: AC
  Administered 2013-05-22: 1 g via INTRAVENOUS
  Filled 2013-05-22: qty 50

## 2013-05-22 SURGICAL SUPPLY — 41 items
APPLICATOR COTTON TIP 6IN STRL (MISCELLANEOUS) ×2 IMPLANT
CLOTH BEACON ORANGE TIMEOUT ST (SAFETY) ×2 IMPLANT
CORDS BIPOLAR (ELECTRODE) ×1 IMPLANT
DRAPE OPHTHALMIC 77X100 STRL (CUSTOM PROCEDURE TRAY) ×2 IMPLANT
ERASER HMR WETFIELD 23G BP (MISCELLANEOUS) ×1 IMPLANT
GLOVE BIOGEL M 7.0 STRL (GLOVE) ×1 IMPLANT
GLOVE SS BIOGEL STRL SZ 8.5 (GLOVE) ×1 IMPLANT
GLOVE SUPERSENSE BIOGEL SZ 8.5 (GLOVE) ×1
GOWN EXTRA PROTECTION XL (GOWNS) ×2 IMPLANT
GOWN STRL NON-REIN LRG LVL3 (GOWN DISPOSABLE) ×2 IMPLANT
ILLUMINATOR ENDO 25GA (MISCELLANEOUS) ×2 IMPLANT
KIT ROOM TURNOVER OR (KITS) ×2 IMPLANT
KNIFE GRIESHABER SHARP 2.5MM (MISCELLANEOUS) ×1 IMPLANT
LENS BIOM SUPER VIEW SET DISP (OPHTHALMIC RELATED) ×2 IMPLANT
MARKER SKIN DUAL TIP RULER LAB (MISCELLANEOUS) ×2 IMPLANT
MASK EYE SHIELD (GAUZE/BANDAGES/DRESSINGS) ×1 IMPLANT
MICROPICK 25G (MISCELLANEOUS)
NDL 25GX 5/8IN NON SAFETY (NEEDLE) ×1 IMPLANT
NEEDLE 25GX 5/8IN NON SAFETY (NEEDLE) ×2 IMPLANT
NS IRRIG 1000ML POUR BTL (IV SOLUTION) ×2 IMPLANT
OIL SILICONE OPHTHALMIC ADAPTO (Ophthalmic Related) ×1 IMPLANT
PACK VITRECTOMY CUSTOM (CUSTOM PROCEDURE TRAY) ×2 IMPLANT
PACK VITRECTOMY PIC MCHSVP (PACKS) ×2 IMPLANT
PAD ARMBOARD 7.5X6 YLW CONV (MISCELLANEOUS) ×3 IMPLANT
PAD EYE OVAL STERILE LF (GAUZE/BANDAGES/DRESSINGS) ×1 IMPLANT
PICK MICROPICK 25G (MISCELLANEOUS) IMPLANT
PROBE DIRECTIONAL LASER (MISCELLANEOUS) ×1 IMPLANT
RESERVOIR BACK FLUSH (MISCELLANEOUS) ×1 IMPLANT
ROLLS DENTAL (MISCELLANEOUS) ×2 IMPLANT
STOCKINETTE IMPERVIOUS 9X36 MD (GAUZE/BANDAGES/DRESSINGS) ×4 IMPLANT
SUT MERSILENE 5 0 RD 1 DA (SUTURE) ×2 IMPLANT
SUT PROLENE 7 0 P 1 (SUTURE) IMPLANT
SUT PROLENE 8 0 BV175 6 (SUTURE) IMPLANT
SUT SILK 2 0 (SUTURE)
SUT SILK 2-0 18XBRD TIE 12 (SUTURE) IMPLANT
SUT VICRYL 7 0 TG140 8 (SUTURE) IMPLANT
SYR TB 1ML LUER SLIP (SYRINGE) ×2 IMPLANT
TAPE SURG TRANSPORE 1 IN (GAUZE/BANDAGES/DRESSINGS) IMPLANT
TAPE SURGICAL TRANSPORE 1 IN (GAUZE/BANDAGES/DRESSINGS) ×1
TOWEL OR 17X24 6PK STRL BLUE (TOWEL DISPOSABLE) ×4 IMPLANT
WATER STERILE IRR 1000ML POUR (IV SOLUTION) ×2 IMPLANT

## 2013-05-22 NOTE — Anesthesia Postprocedure Evaluation (Signed)
Anesthesia Post Note  Patient: Brent Phelps  Procedure(s) Performed: Procedure(s) (LRB): PARS PLANA VITRECTOMY WITH 20 GAUGE WITH  ENDO LASER AND SILICONE INJECTION LEFT EYE; Reconstruction Ruptured globe, Drainage subretinal hematoma; Evacuation of anterior chamber clot. (Left) PHOTOCOAGULATION WITH LASER (Left) INJECTION OF SILICONE OIL (Left)  Anesthesia type: general  Patient location: PACU  Post pain: Pain level controlled  Post assessment: Patient's Cardiovascular Status Stable  Last Vitals:  Filed Vitals:   05/22/13 1027  BP: 141/86  Pulse: 61  Temp:   Resp: 22    Post vital signs: Reviewed and stable  Level of consciousness: sedated  Complications: No apparent anesthesia complications

## 2013-05-22 NOTE — Anesthesia Preprocedure Evaluation (Addendum)
Anesthesia Evaluation  Patient identified by MRN, date of birth, ID band Patient awake    Reviewed: Allergy & Precautions, H&P , NPO status , Patient's Chart, lab work & pertinent test results  History of Anesthesia Complications Negative for: history of anesthetic complications  Airway Mallampati: II TM Distance: >3 FB Neck ROM: full    Dental  (+) Teeth Intact and Dental Advidsory Given   Pulmonary shortness of breath, Current Smoker,          Cardiovascular negative cardio ROS      Neuro/Psych PSYCHIATRIC DISORDERS negative psych ROS   GI/Hepatic negative GI ROS, Neg liver ROS,   Endo/Other  negative endocrine ROS  Renal/GU negative Renal ROS     Musculoskeletal   Abdominal   Peds  Hematology   Anesthesia Other Findings   Reproductive/Obstetrics                          Anesthesia Physical Anesthesia Plan  ASA: II  Anesthesia Plan: General   Post-op Pain Management:    Induction:   Airway Management Planned: Oral ETT  Additional Equipment:   Intra-op Plan:   Post-operative Plan: Extubation in OR  Informed Consent:   Dental Advisory Given  Plan Discussed with: CRNA, Anesthesiologist and Surgeon  Anesthesia Plan Comments:        Anesthesia Quick Evaluation

## 2013-05-22 NOTE — Progress Notes (Signed)
Report given to angel rn as caregiver 

## 2013-05-22 NOTE — Progress Notes (Signed)
Dr. Luciana Axe called to clarify eye drop orders for 3 doses of zymaxid and mydriacyl  to see how far apart to space them. He said to space them 5 minutes apart and okay to give 1 dose of phenylephrine and cyclogyl as ordered.

## 2013-05-22 NOTE — Brief Op Note (Signed)
05/22/2013  9:01 AM  PATIENT:  Brent Phelps  67 y.o. male  PRE-OPERATIVE DIAGNOSIS:  TOTAL RETINAL DETACHMENT AND VITREAL HEMORRHAGE LEFT EYE,, subretinal hemorrhage, total hyphema (clotted)  POST-OPERATIVE DIAGNOSIS:  TOTAL RETINAL DETACHMENT AND VITREAL HEMORRHAGE LEFT EYE....same  PROCEDURE:  PARS PLANA VITRECTOMY 25 GAUGE, WITH ENDOLASER, INTERNAL DRAINAGE OF SUBRETINAL HEMORRHAGE, AND REMOVAL OF ANTERIOR CHAMBER CLOT, AND HYPHEMA, OS, WITH INJECTION OF SILICONE OIL 5000 centistokes  SURGEON:  Surgeon(s) and Role:    * Edmon Crape, MD - Primary  PHYSICIAN ASSISTANT:   ASSISTANTS: none   ANESTHESIA:   general  EBL:  Total I/O In: 1000 [I.V.:1000] Out: 5 [Blood:5]  BLOOD ADMINISTERED:none  DRAINS: none   LOCAL MEDICATIONS USED:  NONE  SPECIMEN:  No Specimen  DISPOSITION OF SPECIMEN:  N/A  COUNTS:  YES  TOURNIQUET:  * No tourniquets in log *  DICTATION: .Other Dictation: Dictation Number [id number]  PLAN OF CARE: Discharge to home after PACU  PATIENT DISPOSITION:  PACU - hemodynamically stable.   Delay start of Pharmacological VTE agent (>24hrs) due to surgical blood loss or risk of bleeding: not applicable

## 2013-05-22 NOTE — H&P (Signed)
Brent Phelps is an 67 y.o. male.   Chief Complaint: VISION LOSS LEFT EYE AFTER RUPTURED GLOBE, TOTAL HYPHEMA, AND RETINAL DETACHMENT FROM GOLF BALL INJURY, OS. HPI: S/P RUPTURED GLOBE AND PRIMARY REPAIR OS, NOW FOR ATTEMPTED GLOBE RECONSTRUCTION, AND RETINAL DETACHMENT REPAIR OS.  Past Medical History  Diagnosis Date  . Erectile dysfunction   . Elevated prostate specific antigen (PSA)   . Prostate cancer   . Shortness of breath on exertion     Past Surgical History  Procedure Laterality Date  . Anal fissure repair  AGE 40  . Thoracic fusion  1992      RE-DO T9 - T10 (PRIOR DISKECTOMY IN 1991)  . Radioactive seed implant  06/20/2012    Procedure: RADIOACTIVE SEED IMPLANT;  Surgeon: Kathi Ludwig, MD;  Location: [facility];  Service: Urology;  Laterality: N/A;  83 seeds implanted  . Cystoscopy  06/20/2012    Procedure: CYSTOSCOPY FLEXIBLE;  Surgeon: Kathi Ludwig, MD;  Location: [facility];  Service: Urology;  Laterality: N/A;  no seeds found in bladder  . Cystoscopy  06/20/2012    Procedure: CYSTOSCOPY;  Surgeon: Kathi Ludwig, MD;  Location: [facility];  Service: Urology;;  . Ruptured globe exploration and repair Left 05/12/2013    Procedure: REPAIR OF RUPTURED GLOBE WITH CLOSURE OF LACERATION LOWER EYELID;  Surgeon: Corinda Gubler, MD;  Location: [facility] OR;  Service: Ophthalmology;  Laterality: Left;    Family History  Problem Relation Age of Onset  . Cancer Brother     prostate  . Cancer Sister     breast  . Cancer Brother     colon   Social History:  reports that he has been smoking Cigarettes.  He has a 12.5 pack-year smoking history. He has never used smokeless tobacco. He reports that he does not drink alcohol or use illicit drugs.  Allergies:  Allergies  Allergen Reactions  . Codeine Nausea And Vomiting    Medications Prior to Admission  Medication Sig Dispense Refill  . acetaminophen (TYLENOL)  325 MG tablet Take 650 mg by mouth every 6 (six) hours as needed for pain.      Marland Kitchen HYDROmorphone (DILAUDID) 4 MG tablet Take 1 tablet (4 mg total) by mouth every 4 (four) hours as needed for pain.  30 tablet  0  . moxifloxacin (VIGAMOX) 0.5 % ophthalmic solution Place 1 drop into the left eye 2 (two) times daily.      . prednisoLONE acetate (PRED FORTE) 1 % ophthalmic suspension Place 1 drop into the left eye 4 (four) times daily.        Results for orders placed during the hospital encounter of 05/21/13 (from the past 48 hour(s))  BASIC METABOLIC PANEL     Status: Abnormal   Collection Time    05/21/13  3:15 PM      Result Value Range   Sodium 139  135 - 145 mEq/L   Potassium 3.7  3.5 - 5.1 mEq/L   Chloride 100  96 - 112 mEq/L   CO2 30  19 - 32 mEq/L   Glucose, Bld 139 (*) 70 - 99 mg/dL   BUN 16  6 - 23 mg/dL   Creatinine, Ser 7.82  0.50 - 1.35 mg/dL   Calcium 9.7  8.4 - 95.6 mg/dL   GFR calc non Af Amer 89 (*) >90 mL/min   GFR calc Af Amer >90  >90 mL/min   Comment:  The eGFR has been calculated     using the CKD EPI equation.     This calculation has not been     validated in all clinical     situations.     eGFR's persistently     <90 mL/min signify     possible Chronic Kidney Disease.  CBC     Status: None   Collection Time    05/21/13  3:15 PM      Result Value Range   WBC 5.1  4.0 - 10.5 K/uL   RBC 4.36  4.22 - 5.81 MIL/uL   Hemoglobin 13.7  13.0 - 17.0 g/dL   HCT 16.1  09.6 - 04.5 %   MCV 90.1  78.0 - 100.0 fL   MCH 31.4  26.0 - 34.0 pg   MCHC 34.9  30.0 - 36.0 g/dL   RDW 40.9  81.1 - 91.4 %   Platelets 184  150 - 400 K/uL   Dg Chest 2 View  05/21/2013   *RADIOLOGY REPORT*  Clinical Data: Preop, tobacco use.  CHEST - 2 VIEW  Comparison: November 02, 2011.  Findings: Cardiomediastinal silhouette appears normal.  Right lung is clear.  Blunting of left costophrenic sulcus is noted which is unchanged compared to prior exam and most consistent with scarring. No  acute disease is noted in the left lung.  No pneumothorax is noted.  IMPRESSION: No acute cardiopulmonary abnormality seen.   Original Report Authenticated By: Lupita Raider.,  M.D.    Review of Systems  Constitutional: Negative.   Eyes: Positive for blurred vision and redness.  Respiratory: Negative.   Cardiovascular: Negative.   Gastrointestinal: Negative.   Genitourinary: Negative.   Musculoskeletal: Negative.   Skin: Negative.   Neurological: Positive for headaches.  Endo/Heme/Allergies: Negative.   Psychiatric/Behavioral: Negative.     Blood pressure 129/78, pulse 66, temperature 97 F (36.1 C), temperature source Oral, resp. rate 18, SpO2 97.00%. Physical Exam  Constitutional: He is oriented to person, place, and time. Vital signs are normal. He appears well-developed and well-nourished.  HENT:  Head: Normocephalic.  Eyes: EOM and lids are normal. Left conjunctiva has a hemorrhage.    OS WITH LP ACCURATE VISION,   WITH DENSE HYPHEMA, POST SCLERAL RUPTURE.  HAS VITREOUS HEMORRHAGE OS, WITH SHALLOW RETINAL DETACHMENT, VISUALIZED BY OFFICE ULTRASOUND  Neck: Trachea normal and normal range of motion.  Cardiovascular: Normal rate, regular rhythm and normal heart sounds.   Respiratory: Effort normal and breath sounds normal.  GI: Soft.  Neurological: He is alert and oriented to person, place, and time. He has normal reflexes.  Skin: Skin is warm and dry.  Psychiatric: He has a normal mood and affect. His speech is normal and behavior is normal. Judgment and thought content normal. Cognition and memory are normal.     Assessment/Plan RUPTURED GLOBE OS, WITH TOTAL HYPHEMA, AND VISION LOSS, STILL WITH LP VISION.  PATIENT AND FAMILY UNDERSTAND THIS IS ATTEMPT TO RECONSTRUCT GLOBE, AND STABILIZE VISION OS.  PLAN GLOBE  RECONSTRUCTION OS, WITH ANTERIOR CHAMBER WASHOUT,REMOVAL OF CLOT, AND REPAIR OF RETINAL DETACHMENT OS.  Brent Phelps A 05/22/2013, 7:26 AM

## 2013-05-22 NOTE — Transfer of Care (Signed)
Immediate Anesthesia Transfer of Care Note  Patient: Brent Phelps  Procedure(s) Performed: Procedure(s): PARS PLANA VITRECTOMY WITH 20 GAUGE WITH  ENDO LASER AND SILICONE INJECTION LEFT EYE; Reconstruction Ruptured globe, Drainage subretinal hematoma; Evacuation of anterior chamber clot. (Left) PHOTOCOAGULATION WITH LASER (Left) INJECTION OF SILICONE OIL (Left)  Patient Location: PACU  Anesthesia Type:General  Level of Consciousness: awake, alert  and oriented  Airway & Oxygen Therapy: Patient Spontanous Breathing and Patient connected to nasal cannula oxygen  Post-op Assessment: Report given to PACU RN, Post -op Vital signs reviewed and stable and Patient moving all extremities X 4  Post vital signs: Reviewed and stable  Complications: No apparent anesthesia complications

## 2013-05-22 NOTE — Anesthesia Procedure Notes (Signed)
Procedure Name: Intubation Date/Time: 05/22/2013 7:43 AM Performed by: Carmela Rima Pre-anesthesia Checklist: Patient identified, Timeout performed, Emergency Drugs available, Patient being monitored and Suction available Patient Re-evaluated:Patient Re-evaluated prior to inductionOxygen Delivery Method: Circle system utilized Preoxygenation: Pre-oxygenation with 100% oxygen Intubation Type: IV induction Ventilation: Mask ventilation without difficulty Laryngoscope Size: Mac and 3 Grade View: Grade II Tube type: Oral Tube size: 7.5 mm Number of attempts: 1 Placement Confirmation: ETT inserted through vocal cords under direct vision,  positive ETCO2 and breath sounds checked- equal and bilateral Secured at: 23 cm Tube secured with: Tape Dental Injury: Teeth and Oropharynx as per pre-operative assessment

## 2013-05-23 ENCOUNTER — Encounter (HOSPITAL_COMMUNITY): Payer: Self-pay | Admitting: Ophthalmology

## 2013-05-23 NOTE — Op Note (Signed)
NAME:  ABDIMALIK, Brent Phelps NO.:  [id number]  MEDICAL RECORD NO.:  [id number]  LOCATION:  [facility]                         FACILITY:  [facility]  PHYSICIAN:  Alford Highland. Landree Fernholz, M.D.   DATE OF BIRTH:  07/03/1946  DATE OF PROCEDURE:  05/22/2013 DATE OF DISCHARGE:  05/22/2013                              OPERATIVE REPORT   PREOPERATIVE DIAGNOSES: 1. Status post recent ruptured globe, left eye and primary repair by     Dr. Aura Camps. 2. Total combined tractional rhegmatogenous retinal detachment, left     eye, with subretinal hemorrhage. 3. Dense vitreous hemorrhage. 4. Total hyphema - clotted blood in the anterior chamber, left eye.  POSTOPERATIVE DIAGNOSES: 1. Status post recent ruptured globe, left eye and primary repair by     Dr. Aura Camps. 2. Total combined tractional rhegmatogenous retinal detachment, left     eye, with subretinal hemorrhage. 3. Dense vitreous hemorrhage. 4. Total hyphema - clotted blood in the anterior chamber, left eye.  PROCEDURES: 1. Posterior vitrectomy with endolaser photocoagulation, repair of     complex retinal detachment, left eye via removal of dense vitreous     hemorrhage, creation of retinotomy, air-fluid exchange, endolaser     photocoagulation, and finally injection of vitreous substitute-     silicone oil 5000 centistokes, left eye. 2. Removal of anterior chamber clot and hyphema.  SURGEON:  Alford Highland. Jazline Cumbee, M.D.  ANESTHESIA:  General anesthesia.  INDICATION FOR PROCEDURE:  The patient is a 67 year old man who had suffered primary rupture of globe from blunt optic projectile - scalpel within the last 8 days.  The patient understands this is an attempt to salvage the globe with the potential for side vision on the left side. He understand the high risk of vision loss, because the underlying nature of the disorder despite even the surgical repair attempted and wished to proceed.  DESCRIPTION OF PROCEDURE:  After  appropriate signed consent was obtained, the patient was taken to the operating room.  In the operating room, appropriate monitors were followed by mild sedation.  Proper site selection was confirmed.  General anesthesia induced without difficulty. After general anesthesia, appropriate site selection was confirmed with the operating staff and surgeon.  Thereafter, left periocular region was sterilely prepped and draped in the usual ophthalmic fashion.  A lid speculum was applied.  A 25-gauge trocar was initially placed in the anterior chamber and separate trocar and finally, a paracentesis incision was made in the superiorly at 12 o'clock, so as to allow access to a dense clotted old blood in the anterior chamber.  The clot was removed piecemeal with vitrectomy instrumentation.  Care was taken to extend this removal of the clot, which extended from the anterior chamber and through the visual axis into the anterior vitreous cavity.  No iris remnants were found and no lens were found as was expected on the B-scan ultrasonography in the office setting preoperatively.  After I cleared the visual axis to the region into the midvitreous, so it was necessary to convert the effusion to the posterior vitreous cavity.  It must be noted that prior to the clot removal of the anterior chamber, deepening had occurred using  Viscoat to coat the endothelium for protective purposes.  Corneal epithelium clouded almost immediately during the procedure and this was removed using 0.01 Grieshaber blade to remove the epithelium from the visual axis to allow for an enhanced visualization.  Corneal striae, but mild haze was present unless I was able to finish the remainder of the procedure without difficulty.  The endothelium coated with Viscoat was quite helpful.  The infusion was then changed to posterior vitreous cavity through the pars plana, now I was able to put the posterior trocars into the posterior  vitreous cavity as well through the pars plana.  There placement in the vitreous cavity verified visually.  At this time, the remainder of the clot in the anterior chamber of the vitreous cavity could be addressed and finally removed.  Total retinal detachment was identified.  Care was taken to avoid retinal contact.  I was able to actually remove the vitreous opacification of the hemorrhage to the point where I could see the optic nerve at the base of the total detachment.  Apparent subretinal hemorrhage was noted.  I was able to create a small retinotomy inferior of the optic nerve using vitrectomy instrumentation and allow for fluid-fluid exchange, from which, the subretinal space was evacuated and dense dark subretinal blood, which thankfully free-flowed nicely.  This allowed for partial retinal reattachment.  At this time, I was able to convert to air injection and this furthered the subretinal evacuation of subretinal fluid and mostly dark subretinal hemorrhage.  No clots were identified.  Retina reattached nicely. Visual inspection was superb.  A thin layer of dark subretinal blood was throughout the posterior pole at the close of the procedure, this was not deemed upon advisable to remove.  Endolaser photocoagulation was placed around the retinotomy site.  Retina was reattached nicely.  There was no peripherally traction.  Peripheral retina was then attached.  At this time, I removed the supranasal trocar and opened the site to allow for closure of the scleral directive 7-0 Vicryl suture.  The superotemporal sclerotomy site was enlarged.  The trocar was removed and the site was enlarged with a 20-gauge MVR allowing passive injection of silicone oil via the syringe into the vitreous cavity.  Excellent oil fill was obtained.  Care was taken to avoid overfill.  Air was left in the anterior chamber.  The sclerotomy was closed with pre-placed 7-0 Vicryl suture.  The infusion was  then closed with 7-0 Vicryl suture and removed.  The conjunctiva and each of the superior quadrant was closed with 7-0 Vicryl suture.  Subconjunctival Decadron was applied. Intraocular pressure was assessed and found to be adequate.  Several patches of Fox shield applied.  The patient tolerated the procedure well without complication.  Discharged home with outpatient later today. After awakened from anesthesia, the patient was taken to the PACU.     Alford Highland Akirra Lacerda, M.D.     GAR/MEDQ  D:  05/22/2013  T:  05/23/2013  Job:  [id number]

## 2013-07-10 ENCOUNTER — Encounter (HOSPITAL_COMMUNITY): Payer: Self-pay | Admitting: Adult Health

## 2013-07-10 ENCOUNTER — Inpatient Hospital Stay (HOSPITAL_COMMUNITY)
Admission: EM | Admit: 2013-07-10 | Discharge: 2013-07-14 | DRG: 378 | Disposition: A | Discharge status: Home / Self Care | Payer: Medicare Other | Attending: Family Medicine | Admitting: Family Medicine

## 2013-07-10 DIAGNOSIS — Z800 Family history of malignant neoplasm of digestive organs: Secondary | ICD-10-CM

## 2013-07-10 DIAGNOSIS — F17200 Nicotine dependence, unspecified, uncomplicated: Secondary | ICD-10-CM | POA: Diagnosis present

## 2013-07-10 DIAGNOSIS — K219 Gastro-esophageal reflux disease without esophagitis: Secondary | ICD-10-CM | POA: Diagnosis present

## 2013-07-10 DIAGNOSIS — K922 Gastrointestinal hemorrhage, unspecified: Secondary | ICD-10-CM | POA: Diagnosis present

## 2013-07-10 DIAGNOSIS — Z8546 Personal history of malignant neoplasm of prostate: Secondary | ICD-10-CM

## 2013-07-10 DIAGNOSIS — Z79899 Other long term (current) drug therapy: Secondary | ICD-10-CM

## 2013-07-10 DIAGNOSIS — K269 Duodenal ulcer, unspecified as acute or chronic, without hemorrhage or perforation: Secondary | ICD-10-CM | POA: Diagnosis present

## 2013-07-10 DIAGNOSIS — T3995XA Adverse effect of unspecified nonopioid analgesic, antipyretic and antirheumatic, initial encounter: Secondary | ICD-10-CM | POA: Diagnosis present

## 2013-07-10 DIAGNOSIS — C61 Malignant neoplasm of prostate: Secondary | ICD-10-CM

## 2013-07-10 DIAGNOSIS — Z803 Family history of malignant neoplasm of breast: Secondary | ICD-10-CM

## 2013-07-10 DIAGNOSIS — K264 Chronic or unspecified duodenal ulcer with hemorrhage: Principal | ICD-10-CM | POA: Diagnosis present

## 2013-07-10 DIAGNOSIS — Z23 Encounter for immunization: Secondary | ICD-10-CM

## 2013-07-10 DIAGNOSIS — K449 Diaphragmatic hernia without obstruction or gangrene: Secondary | ICD-10-CM | POA: Diagnosis present

## 2013-07-10 DIAGNOSIS — Z8042 Family history of malignant neoplasm of prostate: Secondary | ICD-10-CM

## 2013-07-10 DIAGNOSIS — D62 Acute posthemorrhagic anemia: Secondary | ICD-10-CM | POA: Diagnosis present

## 2013-07-10 DIAGNOSIS — Z981 Arthrodesis status: Secondary | ICD-10-CM

## 2013-07-10 LAB — CBC
HCT: 28.1 % — ABNORMAL LOW (ref 39.0–52.0)
Hemoglobin: 9.9 g/dL — ABNORMAL LOW (ref 13.0–17.0)
MCH: 31.3 pg (ref 26.0–34.0)
MCHC: 35.2 g/dL (ref 30.0–36.0)
MCV: 88.9 fL (ref 78.0–100.0)
Platelets: 186 10*3/uL (ref 150–400)
RBC: 3.16 MIL/uL — ABNORMAL LOW (ref 4.22–5.81)
RDW: 12.7 % (ref 11.5–15.5)
WBC: 7.5 10*3/uL (ref 4.0–10.5)

## 2013-07-10 LAB — BASIC METABOLIC PANEL
CO2: 23 mEq/L (ref 19–32)
Calcium: 9 mg/dL (ref 8.4–10.5)
Creatinine, Ser: 0.73 mg/dL (ref 0.50–1.35)
Glucose, Bld: 150 mg/dL — ABNORMAL HIGH (ref 70–99)

## 2013-07-10 MED ORDER — FENTANYL CITRATE 0.05 MG/ML IJ SOLN
100.0000 ug | Freq: Once | INTRAMUSCULAR | Status: AC
  Administered 2013-07-10: 100 ug via INTRAVENOUS
  Filled 2013-07-10: qty 2

## 2013-07-10 MED ORDER — ONDANSETRON HCL 4 MG/2ML IJ SOLN
4.0000 mg | Freq: Once | INTRAMUSCULAR | Status: AC
  Administered 2013-07-10: 4 mg via INTRAVENOUS
  Filled 2013-07-10: qty 2

## 2013-07-10 NOTE — ED Notes (Signed)
Presents with cold sweats, nausea, dizziness. Went to doctor to be seen and told to come in due to HGB 10.2 down from 4 days ago at 14.0. Pt denies vomiting, denies diarrhea, denies blood in stool.  Denies pain.

## 2013-07-11 ENCOUNTER — Encounter (HOSPITAL_COMMUNITY): Payer: Self-pay | Admitting: Radiology

## 2013-07-11 ENCOUNTER — Encounter (HOSPITAL_COMMUNITY)
Admission: EM | Disposition: A | Discharge status: Home / Self Care | Payer: Medicare Other | Source: Home / Self Care | Attending: Internal Medicine

## 2013-07-11 ENCOUNTER — Emergency Department (HOSPITAL_COMMUNITY): Payer: Medicare Other

## 2013-07-11 DIAGNOSIS — K922 Gastrointestinal hemorrhage, unspecified: Secondary | ICD-10-CM | POA: Diagnosis present

## 2013-07-11 DIAGNOSIS — D62 Acute posthemorrhagic anemia: Secondary | ICD-10-CM

## 2013-07-11 HISTORY — PX: ESOPHAGOGASTRODUODENOSCOPY: SHX5428

## 2013-07-11 LAB — PREPARE RBC (CROSSMATCH)

## 2013-07-11 LAB — COMPREHENSIVE METABOLIC PANEL
ALT: 8 U/L (ref 0–53)
Albumin: 2.9 g/dL — ABNORMAL LOW (ref 3.5–5.2)
Alkaline Phosphatase: 44 U/L (ref 39–117)
Potassium: 3.8 mEq/L (ref 3.5–5.1)
Sodium: 137 mEq/L (ref 135–145)
Total Protein: 5.3 g/dL — ABNORMAL LOW (ref 6.0–8.3)

## 2013-07-11 LAB — CBC
Hemoglobin: 8.1 g/dL — ABNORMAL LOW (ref 13.0–17.0)
Hemoglobin: 7.6 g/dL — ABNORMAL LOW (ref 13.0–17.0)
MCH: 31.3 pg (ref 26.0–34.0)
MCH: 32 pg (ref 26.0–34.0)
MCHC: 35.4 g/dL (ref 30.0–36.0)
MCHC: 35.3 g/dL (ref 30.0–36.0)
MCV: 88.4 fL (ref 78.0–100.0)
Platelets: 147 10*3/uL — ABNORMAL LOW (ref 150–400)
RDW: 12.7 % (ref 11.5–15.5)
RDW: 12.9 % (ref 11.5–15.5)
RDW: 12.7 % (ref 11.5–15.5)
WBC: 5 10*3/uL (ref 4.0–10.5)

## 2013-07-11 LAB — GLUCOSE, CAPILLARY
Glucose-Capillary: 111 mg/dL — ABNORMAL HIGH (ref 70–99)
Glucose-Capillary: 96 mg/dL (ref 70–99)
Glucose-Capillary: 109 mg/dL — ABNORMAL HIGH (ref 70–99)

## 2013-07-11 LAB — URINALYSIS, ROUTINE W REFLEX MICROSCOPIC
Bilirubin Urine: NEGATIVE
Glucose, UA: NEGATIVE mg/dL
Hgb urine dipstick: NEGATIVE
Ketones, ur: NEGATIVE mg/dL
Protein, ur: NEGATIVE mg/dL

## 2013-07-11 LAB — POCT I-STAT TROPONIN I: Troponin i, poc: 0.02 ng/mL (ref 0.00–0.08)

## 2013-07-11 SURGERY — EGD (ESOPHAGOGASTRODUODENOSCOPY)
Anesthesia: Moderate Sedation

## 2013-07-11 MED ORDER — ALBUTEROL SULFATE HFA 108 (90 BASE) MCG/ACT IN AERS: 2.0000 | INHALATION_SPRAY | Freq: Four times a day (QID) | RESPIRATORY_TRACT | Status: DC | PRN

## 2013-07-11 MED ORDER — BUTAMBEN-TETRACAINE-BENZOCAINE 2-2-14 % EX AERO
INHALATION_SPRAY | CUTANEOUS | Status: DC | PRN
  Administered 2013-07-11: 2 via TOPICAL

## 2013-07-11 MED ORDER — MIDAZOLAM HCL 5 MG/ML IJ SOLN
INTRAMUSCULAR | Status: AC
  Filled 2013-07-11: qty 2

## 2013-07-11 MED ORDER — SODIUM CHLORIDE 0.9 % IV SOLN
INTRAVENOUS | Status: AC
  Administered 2013-07-11: 04:00:00 via INTRAVENOUS
  Administered 2013-07-11: 1 mL via INTRAVENOUS

## 2013-07-11 MED ORDER — OXYCODONE-ACETAMINOPHEN 5-325 MG PO TABS: 1.0000 | ORAL_TABLET | ORAL | Status: DC | PRN

## 2013-07-11 MED ORDER — ACETAMINOPHEN 325 MG PO TABS
650.0000 mg | ORAL_TABLET | Freq: Four times a day (QID) | ORAL | Status: DC | PRN
  Administered 2013-07-11 – 2013-07-13 (×2): 650 mg via ORAL
  Filled 2013-07-11 (×2): qty 2

## 2013-07-11 MED ORDER — SODIUM CHLORIDE 0.9 % IV SOLN
80.0000 mg | Freq: Once | INTRAVENOUS | Status: AC
  Administered 2013-07-11: 80 mg via INTRAVENOUS
  Filled 2013-07-11: qty 80

## 2013-07-11 MED ORDER — IOHEXOL 350 MG/ML SOLN
100.0000 mL | Freq: Once | INTRAVENOUS | Status: AC | PRN
  Administered 2013-07-11: 100 mL via INTRAVENOUS

## 2013-07-11 MED ORDER — SODIUM CHLORIDE 0.9 % IV SOLN: INTRAVENOUS | Status: DC

## 2013-07-11 MED ORDER — SODIUM CHLORIDE 0.9 % IV SOLN
8.0000 mg/h | INTRAVENOUS | Status: DC
  Administered 2013-07-11 – 2013-07-13 (×5): 8 mg/h via INTRAVENOUS
  Filled 2013-07-11 (×13): qty 80

## 2013-07-11 MED ORDER — MIDAZOLAM HCL 10 MG/2ML IJ SOLN
INTRAMUSCULAR | Status: DC | PRN
  Administered 2013-07-11: 1 mg via INTRAVENOUS
  Administered 2013-07-11 (×2): 2 mg via INTRAVENOUS

## 2013-07-11 MED ORDER — SODIUM CHLORIDE 0.9 % IV SOLN: 80.0000 mg | Freq: Two times a day (BID) | INTRAVENOUS | Status: DC

## 2013-07-11 MED ORDER — ACETAMINOPHEN 650 MG RE SUPP: 650.0000 mg | Freq: Four times a day (QID) | RECTAL | Status: DC | PRN

## 2013-07-11 MED ORDER — SODIUM CHLORIDE 0.9 % IJ SOLN
3.0000 mL | Freq: Two times a day (BID) | INTRAMUSCULAR | Status: DC
  Administered 2013-07-11 – 2013-07-14 (×4): 3 mL via INTRAVENOUS

## 2013-07-11 MED ORDER — FENTANYL CITRATE 0.05 MG/ML IJ SOLN
100.0000 ug | INTRAMUSCULAR | Status: AC
Start: 2013-07-11 — End: 2013-07-11
  Administered 2013-07-11: 100 ug via INTRAVENOUS
  Filled 2013-07-11: qty 2

## 2013-07-11 MED ORDER — ONDANSETRON HCL 4 MG PO TABS: 4.0000 mg | ORAL_TABLET | Freq: Four times a day (QID) | ORAL | Status: DC | PRN

## 2013-07-11 MED ORDER — ONDANSETRON HCL 4 MG/2ML IJ SOLN: 4.0000 mg | Freq: Four times a day (QID) | INTRAMUSCULAR | Status: DC | PRN

## 2013-07-11 MED ORDER — FENTANYL CITRATE 0.05 MG/ML IJ SOLN
INTRAMUSCULAR | Status: AC
  Filled 2013-07-11: qty 2

## 2013-07-11 MED ORDER — FENTANYL CITRATE 0.05 MG/ML IJ SOLN
INTRAMUSCULAR | Status: DC | PRN
  Administered 2013-07-11 (×2): 25 ug via INTRAVENOUS

## 2013-07-11 MED ORDER — PREDNISOLONE ACETATE 1 % OP SUSP: 1.0000 [drp] | Freq: Two times a day (BID) | OPHTHALMIC | Status: DC | PRN

## 2013-07-11 NOTE — H&P (View-Only) (Signed)
[facility] CONS[facility] Reason for consult: GI Bleeding Referring Physician: [facility]. PCP: Dr Manus Gunning. Primary G.I.: Dr. Royetta Crochet is an 67 y.o. male.  HPI: he is well-known to me. He has routine colonoscopies because of a family history of colon cancer in his brother. His last colonoscopy was one/13 and was negative other than internal hemorrhoids. The patient reports that he has had back pain and right upper quadrant/right flank pain for several weeks and has been taking ibuprofen. He is a bit vague on whether or not he has had dark stool. He has become progressively weaker and presented to the emergency room. His hemoglobin was 9.9 and stools were positive for FOB. It is subsequently dropped 8.1. Patient denies any gross hematochezia. He has not been taking any medication either prescription or OTC or abdominal pain or acid. He has been taking ibuprofen as well as oxycodone.  Past Medical History  Diagnosis Date  . Erectile dysfunction   . Elevated prostate specific antigen (PSA)   . Prostate cancer   . Shortness of breath on exertion     Past Surgical History  Procedure Laterality Date  . Anal fissure repair  AGE 78  . Thoracic fusion  1992      RE-DO T9 - T10 (PRIOR DISKECTOMY IN 1991)  . Radioactive seed implant  06/20/2012    Procedure: RADIOACTIVE SEED IMPLANT;  Surgeon: Kathi Ludwig, MD;  Location: [facility];  Service: Urology;  Laterality: N/A;  83 seeds implanted  . Cystoscopy  06/20/2012    Procedure: CYSTOSCOPY FLEXIBLE;  Surgeon: Kathi Ludwig, MD;  Location: [facility];  Service: Urology;  Laterality: N/A;  no seeds found in bladder  . Cystoscopy  06/20/2012    Procedure: CYSTOSCOPY;  Surgeon: Kathi Ludwig, MD;  Location: [facility];  Service: Urology;;  . Ruptured globe exploration and repair Left 05/12/2013    Procedure: REPAIR OF RUPTURED GLOBE WITH CLOSURE OF LACERATION  LOWER EYELID;  Surgeon: Corinda Gubler, MD;  Location: [facility] OR;  Service: Ophthalmology;  Laterality: Left;  . Pars plana vitrectomy Left 05/22/2013    Procedure: PARS PLANA VITRECTOMY WITH 20 GAUGE WITH  ENDO LASER AND SILICONE INJECTION LEFT EYE; Reconstruction Ruptured globe, Drainage subretinal hematoma; Evacuation of anterior chamber clot.;  Surgeon: Edmon Crape, MD;  Location: [facility] OR;  Service: Ophthalmology;  Laterality: Left;  . Photocoagulation with laser Left 05/22/2013    Procedure: PHOTOCOAGULATION WITH LASER;  Surgeon: Edmon Crape, MD;  Location: [facility] OR;  Service: Ophthalmology;  Laterality: Left;  . Injection of silicone oil Left 05/22/2013    Procedure: INJECTION OF SILICONE OIL;  Surgeon: Edmon Crape, MD;  Location: [facility] OR;  Service: Ophthalmology;  Laterality: Left;    Family History  Problem Relation Age of Onset  . Cancer Brother     prostate  . Cancer Sister     breast  . Cancer Brother     colon    Social History:  reports that he has been smoking Cigarettes.  He has a 12.5 pack-year smoking history. He has never used smokeless tobacco. He reports that he does not drink alcohol or use illicit drugs.  Allergies:  Allergies  Allergen Reactions  . Codeine Nausea And Vomiting    Medications; Prior to Admission medications   Medication Sig Start Date End Date Taking? Authorizing Provider  albuterol (PROVENTIL HFA;VENTOLIN HFA) 108 (90 BASE) MCG/ACT inhaler Inhale 2 puffs into the  lungs every 6 (six) hours as needed for wheezing.   Yes Historical Provider, MD  ibuprofen (ADVIL,MOTRIN) 200 MG tablet Take 400 mg by mouth every 6 (six) hours as needed for pain.   Yes Historical Provider, MD  oxyCODONE-acetaminophen (PERCOCET/ROXICET) 5-325 MG per tablet Take 2 tablets by mouth every 4 (four) hours as needed for pain.   Yes Historical Provider, MD  prednisoLONE acetate (PRED FORTE) 1 % ophthalmic suspension Place 1 drop into the left eye 2 (two) times daily as needed  (lubrication).    Yes Historical Provider, MD   . sodium chloride  3 mL Intravenous Q12H   PRN Meds acetaminophen, acetaminophen, albuterol, ondansetron (ZOFRAN) IV, ondansetron, prednisoLONE acetate Results for orders placed during the hospital encounter of 07/10/13 (from the past 48 hour(s))  CBC     Status: Abnormal   Collection Time    07/10/13  9:37 PM      Result Value Range   WBC 7.5  4.0 - 10.5 K/uL   RBC 3.16 (*) 4.22 - 5.81 MIL/uL   Hemoglobin 9.9 (*) 13.0 - 17.0 g/dL   HCT 78.2 (*) 95.6 - 21.3 %   MCV 88.9  78.0 - 100.0 fL   MCH 31.3  26.0 - 34.0 pg   MCHC 35.2  30.0 - 36.0 g/dL   RDW 08.6  57.8 - 46.9 %   Platelets 186  150 - 400 K/uL  BASIC METABOLIC PANEL     Status: Abnormal   Collection Time    07/10/13  9:37 PM      Result Value Range   Sodium 135  135 - 145 mEq/L   Potassium 3.8  3.5 - 5.1 mEq/L   Chloride 103  96 - 112 mEq/L   CO2 23  19 - 32 mEq/L   Glucose, Bld 150 (*) 70 - 99 mg/dL   BUN 46 (*) 6 - 23 mg/dL   Creatinine, Ser 6.29  0.50 - 1.35 mg/dL   Calcium 9.0  8.4 - 52.8 mg/dL   GFR calc non Af Amer >90  >90 mL/min   GFR calc Af Amer >90  >90 mL/min   Comment: (NOTE)     The eGFR has been calculated using the CKD EPI equation.     This calculation has not been validated in all clinical situations.     eGFR's persistently <90 mL/min signify possible Chronic Kidney     Disease.  POCT I-STAT TROPONIN I     Status: None   Collection Time    07/10/13  9:48 PM      Result Value Range   Troponin i, poc 0.02  0.00 - 0.08 ng/mL   Comment 3            Comment: Due to the release kinetics of cTnI,     a negative result within the first hours     of the onset of symptoms does not rule out     myocardial infarction with certainty.     If myocardial infarction is still suspected,     repeat the test at appropriate intervals.  URINALYSIS, ROUTINE W REFLEX MICROSCOPIC     Status: None   Collection Time    07/11/13 12:17 AM      Result Value Range    Color, Urine YELLOW  YELLOW   APPearance CLEAR  CLEAR   Specific Gravity, Urine 1.026  1.005 - 1.030   pH 5.0  5.0 - 8.0   Glucose, UA NEGATIVE  NEGATIVE mg/dL   Hgb urine dipstick NEGATIVE  NEGATIVE   Bilirubin Urine NEGATIVE  NEGATIVE   Ketones, ur NEGATIVE  NEGATIVE mg/dL   Protein, ur NEGATIVE  NEGATIVE mg/dL   Urobilinogen, UA 0.2  0.0 - 1.0 mg/dL   Nitrite NEGATIVE  NEGATIVE   Leukocytes, UA NEGATIVE  NEGATIVE   Comment: MICROSCOPIC NOT DONE ON URINES WITH NEGATIVE PROTEIN, BLOOD, LEUKOCYTES, NITRITE, OR GLUCOSE <1000 mg/dL.  OCCULT BLOOD, POC DEVICE     Status: Abnormal   Collection Time    07/11/13  1:26 AM      Result Value Range   Fecal Occult Bld POSITIVE (*) NEGATIVE  CBC     Status: Abnormal   Collection Time    07/11/13  4:30 AM      Result Value Range   WBC 4.7  4.0 - 10.5 K/uL   RBC 2.59 (*) 4.22 - 5.81 MIL/uL   Hemoglobin 8.1 (*) 13.0 - 17.0 g/dL   Comment: REPEATED TO VERIFY   HCT 22.9 (*) 39.0 - 52.0 %   MCV 88.4  78.0 - 100.0 fL   MCH 31.3  26.0 - 34.0 pg   MCHC 35.4  30.0 - 36.0 g/dL   RDW 16.1  09.6 - 04.5 %   Platelets 169  150 - 400 K/uL  COMPREHENSIVE METABOLIC PANEL     Status: Abnormal   Collection Time    07/11/13  4:30 AM      Result Value Range   Sodium 137  135 - 145 mEq/L   Potassium 3.8  3.5 - 5.1 mEq/L   Chloride 105  96 - 112 mEq/L   CO2 25  19 - 32 mEq/L   Glucose, Bld 110 (*) 70 - 99 mg/dL   BUN 40 (*) 6 - 23 mg/dL   Creatinine, Ser 4.09  0.50 - 1.35 mg/dL   Calcium 8.1 (*) 8.4 - 10.5 mg/dL   Total Protein 5.3 (*) 6.0 - 8.3 g/dL   Albumin 2.9 (*) 3.5 - 5.2 g/dL   AST 11  0 - 37 U/L   ALT 8  0 - 53 U/L   Alkaline Phosphatase 44  39 - 117 U/L   Total Bilirubin 0.4  0.3 - 1.2 mg/dL   GFR calc non Af Amer >90  >90 mL/min   GFR calc Af Amer >90  >90 mL/min   Comment: (NOTE)     The eGFR has been calculated using the CKD EPI equation.     This calculation has not been validated in all clinical situations.     eGFR's persistently  <90 mL/min signify possible Chronic Kidney     Disease.  TROPONIN I     Status: None   Collection Time    07/11/13  4:30 AM      Result Value Range   Troponin I <0.30  <0.30 ng/mL   Comment:            Due to the release kinetics of cTnI,     a negative result within the first hours     of the onset of symptoms does not rule out     myocardial infarction with certainty.     If myocardial infarction is still suspected,     repeat the test at appropriate intervals.  TYPE AND SCREEN     Status: None   Collection Time    07/11/13  6:50 AM      Result Value Range   ABO/RH(D) O POS  Antibody Screen NEG     Sample Expiration 07/14/2013      Ct Angio Abdomen W/cm &/or Wo Contrast  07/11/2013   *RADIOLOGY REPORT*  Clinical Data:  Evaluate for abdominal aortic aneurysm  CT ANGIOGRAPHY CHEST AND ABDOMEN  Technique:  Multidetector CT imaging through the chest  and abdomen was performed using the standard protocol during bolus administration of intravenous contrast.  Multiplanar reconstructed images including MIPs were obtained and reviewed to evaluate the vascular anatomy.  Contrast: OMNIPAQUE IOHEXOL 350 MG/ML SOLN  Comparison:   None.  CTA CHEST  Findings:  There is no pleural effusion.  There is moderate centrilobular emphysema.  3 mm calcified granulomas identified in the right upper lobe.  No airspace consolidation identified.  There is no axillary or supraclavicular adenopathy identified. There is no mediastinal or hilar adenopathy.  The thoracic aorta has a normal caliber.  There is no dissection.  The heart size is normal.  No pericardial effusion.  Calcifications are noted within the LAD coronary artery.  There is mild multilevel spondylosis identified within the thoracic spine.  No worrisome bony abnormalities identified.   Review of the MIP images confirms the above findings.  IMPRESSION:  1.  No acute cardiopulmonary abnormalities. 2.  No evidence for aneurysm. 3.  Emphysema.  CTA  ABDOMEN AND PELVIS  Findings:  Normal appearance of the liver.  The gallbladder appears normal.  No biliary dilatation.  Normal appearance of the pancreas. Calcified granuloma noted within the spleen.  The adrenal glands are both normal.  Normal appearance of both kidneys.  There is mild calcified atherosclerotic change involving the abdominal aorta.  No aneurysm.  There is no evidence for aortic dissection.  The stomach, visualized small and large bowel loops appear within normal limits.  No free fluid or fluid collections identified within the abdomen or pelvis.  Review of the visualized osseous structures is significant for mild lumbar spondylosis.   Review of the MIP images confirms the above findings.  IMPRESSION:  1.  No acute findings. 2.  No evidence for abdominal aortic aneurysm or dissection.   Original Report Authenticated By: Signa Kell, M.D.   Ct Angio Chest Aorta W/cm &/or Wo/cm  07/11/2013   *RADIOLOGY REPORT*  Clinical Data:  Evaluate for abdominal aortic aneurysm  CT ANGIOGRAPHY CHEST AND ABDOMEN  Technique:  Multidetector CT imaging through the chest  and abdomen was performed using the standard protocol during bolus administration of intravenous contrast.  Multiplanar reconstructed images including MIPs were obtained and reviewed to evaluate the vascular anatomy.  Contrast: OMNIPAQUE IOHEXOL 350 MG/ML SOLN  Comparison:   None.  CTA CHEST  Findings:  There is no pleural effusion.  There is moderate centrilobular emphysema.  3 mm calcified granulomas identified in the right upper lobe.  No airspace consolidation identified.  There is no axillary or supraclavicular adenopathy identified. There is no mediastinal or hilar adenopathy.  The thoracic aorta has a normal caliber.  There is no dissection.  The heart size is normal.  No pericardial effusion.  Calcifications are noted within the LAD coronary artery.  There is mild multilevel spondylosis identified within the thoracic spine.  No  worrisome bony abnormalities identified.   Review of the MIP images confirms the above findings.  IMPRESSION:  1.  No acute cardiopulmonary abnormalities. 2.  No evidence for aneurysm. 3.  Emphysema.  CTA ABDOMEN AND PELVIS  Findings:  Normal appearance of the liver.  The gallbladder appears normal.  No biliary  dilatation.  Normal appearance of the pancreas. Calcified granuloma noted within the spleen.  The adrenal glands are both normal.  Normal appearance of both kidneys.  There is mild calcified atherosclerotic change involving the abdominal aorta.  No aneurysm.  There is no evidence for aortic dissection.  The stomach, visualized small and large bowel loops appear within normal limits.  No free fluid or fluid collections identified within the abdomen or pelvis.  Review of the visualized osseous structures is significant for mild lumbar spondylosis.   Review of the MIP images confirms the above findings.  IMPRESSION:  1.  No acute findings. 2.  No evidence for abdominal aortic aneurysm or dissection.   Original Report Authenticated By: Signa Kell, M.D.               Blood pressure 130/63, pulse 84, temperature 97.9 F (36.6 C), temperature source Oral, resp. rate 18, weight 70.171 kg (154 lb 11.2 oz), SpO2 98.00%.  Physical exam:   General-- pleasant Caucasian male in no acute distress Heart-- regular rate and rhythm without murmurs are gallops Lungs--clear Abdomen-- none distended in soft with mild tenderness primarily in the right flank area   Assessment: 1. Right upper quadrant pain/G.I. bleeding. In view of patient's ibuprofen use in the NSAID induced ulcer most likely explanation.  Plan: 1. We will plan on going ahead with EGD later this morning. Have discussed this with the patient and he is agreeable.     Macel Yearsley JR,Luverta Korte L 07/11/2013, 9:22 AM

## 2013-07-11 NOTE — Interval H&P Note (Signed)
History and Physical Interval Note:  07/11/2013 9:50 AM  Brent Phelps  has presented today for surgery, with the diagnosis of gi bleed  The various methods of treatment have been discussed with the patient and family. After consideration of risks, benefits and other options for treatment, the patient has consented to  Procedure(s): ESOPHAGOGASTRODUODENOSCOPY (EGD) (N/A) as a surgical intervention .  The patient's history has been reviewed, patient examined, no change in status, stable for surgery.  I have reviewed the patient's chart and labs.  Questions were answered to the patient's satisfaction.     Marti Acebo JR,Dola Lunsford L

## 2013-07-11 NOTE — ED Notes (Signed)
Please call Lorenza Chick (daughter) when room is assigned [phone]

## 2013-07-11 NOTE — Progress Notes (Signed)
[facility] PROGRESS NOTE  Brent Phelps YNW:[id number] DOB: [birth year] DOA: 07/10/2013 PCP: [name], MD    In brief [name] is a pleasant 67 year old gentleman who was admitted overnight, presented to the emergency department with complaints of generalized weakness, fatigue, poor tolerance to physical exertion. He was seen at urgent care where he was found to have a drop in his hemoglobin from 13-9. He was Hemoccult-positive in emergent apartment. He denies like tarry stools, bright red blood per rectum or hematemesis. Patient was started on Protonix drip, and GI consulted. Today he underwent endoscopy which showed large duodenal ulcer. Not actively bleeding at this time.   Assessment/Plan:  Acute blood loss anemia Secondary to probable upper GI bleed. Patient's hemoglobin continues to trend down to 7.6. He complains of generalized weakness and fatigue, since he is symptomatic I will transfuse with 1 unit of packed red blood cells.  Upper GI bleed Patient undergoing endoscopy today which showed a large duodenal ulcer. No active bleeding at this time. Gastroenterology recommending aggressive PPI therapy. He remains on a protonix drip.   History of prostate cancer Stable  Gastroesophageal reflux disease On PPI  DVT prophylaxis Bilateral extremity SCDs  Code Status: Full Family Communication: Plan discussed with family members and patient Disposition Plan: Plan to transfuse 1 unit of packed red blood cells, monitor repeat CBC   Consultants: Gastroenterology  Procedures:  EGD (07/11/13)   HPI/Subjective:  Objective: Filed Vitals:   07/11/13 1040  BP:   Pulse: 88  Temp:   Resp: 18    Intake/Output Summary (Last 24 hours) at 07/11/13 1504 Last data filed at 07/11/13 1106  Gross per 24 hour  Intake    540 ml  Output      0 ml  Net    540 ml   Filed Weights   07/10/13 2124 07/11/13 0320  Weight: 70.308 kg (155 lb) 70.171 kg (154 lb 11.2  oz)    Exam:   General:  Patient in no acute distress awake alert  Cardiovascular: Regular rate rhythm normal S1-S2  Respiratory: Lungs are clear to auscultation bilaterally no wheezing rhonchi overall  Abdomen: Soft nontender nondistended positive bowel sounds  Musculoskeletal: Present range of motion to all extremity  Data Reviewed: Basic Metabolic Panel:  Recent Labs Lab 07/10/13 2137 07/11/13 0430  NA 135 137  K 3.8 3.8  CL 103 105  CO2 23 25  GLUCOSE 150* 110*  BUN 46* 40*  CREATININE 0.73 0.70  CALCIUM 9.0 8.1*   Liver Function Tests:  Recent Labs Lab 07/11/13 0430  AST 11  ALT 8  ALKPHOS 44  BILITOT 0.4  PROT 5.3*  ALBUMIN 2.9*   No results found for this basename: LIPASE, AMYLASE,  in the last 168 hours No results found for this basename: AMMONIA,  in the last 168 hours CBC:  Recent Labs Lab 07/10/13 2137 07/11/13 0430 07/11/13 0910 07/11/13 1401  WBC 7.5 4.7 5.0 5.4  HGB 9.9* 8.1* 8.0* 7.6*  HCT 28.1* 22.9* 22.1* 21.5*  MCV 88.9 88.4 88.4 88.5  PLT 186 169 147* 142*   Cardiac Enzymes:  Recent Labs Lab 07/11/13 0430  TROPONINI <0.30   BNP (last 3 results) No results found for this basename: PROBNP,  in the last 8760 hours CBG:  Recent Labs Lab 07/11/13 0834 07/11/13 1244  GLUCAP 111* 96    No results found for this or any previous visit (from the past 240 hour(s)).   Studies: Ct Angio Abdomen W/cm &/  or Wo Contrast  07/11/2013   *RADIOLOGY REPORT*  Clinical Data:  Evaluate for abdominal aortic aneurysm  CT ANGIOGRAPHY CHEST AND ABDOMEN  Technique:  Multidetector CT imaging through the chest  and abdomen was performed using the standard protocol during bolus administration of intravenous contrast.  Multiplanar reconstructed images including MIPs were obtained and reviewed to evaluate the vascular anatomy.  Contrast: OMNIPAQUE IOHEXOL 350 MG/ML SOLN  Comparison:   None.  CTA CHEST  Findings:  There is no pleural effusion.   There is moderate centrilobular emphysema.  3 mm calcified granulomas identified in the right upper lobe.  No airspace consolidation identified.  There is no axillary or supraclavicular adenopathy identified. There is no mediastinal or hilar adenopathy.  The thoracic aorta has a normal caliber.  There is no dissection.  The heart size is normal.  No pericardial effusion.  Calcifications are noted within the LAD coronary artery.  There is mild multilevel spondylosis identified within the thoracic spine.  No worrisome bony abnormalities identified.   Review of the MIP images confirms the above findings.  IMPRESSION:  1.  No acute cardiopulmonary abnormalities. 2.  No evidence for aneurysm. 3.  Emphysema.  CTA ABDOMEN AND PELVIS  Findings:  Normal appearance of the liver.  The gallbladder appears normal.  No biliary dilatation.  Normal appearance of the pancreas. Calcified granuloma noted within the spleen.  The adrenal glands are both normal.  Normal appearance of both kidneys.  There is mild calcified atherosclerotic change involving the abdominal aorta.  No aneurysm.  There is no evidence for aortic dissection.  The stomach, visualized small and large bowel loops appear within normal limits.  No free fluid or fluid collections identified within the abdomen or pelvis.  Review of the visualized osseous structures is significant for mild lumbar spondylosis.   Review of the MIP images confirms the above findings.  IMPRESSION:  1.  No acute findings. 2.  No evidence for abdominal aortic aneurysm or dissection.   Original Report Authenticated By: Signa Kell, M.D.   Ct Angio Chest Aorta W/cm &/or Wo/cm  07/11/2013   *RADIOLOGY REPORT*  Clinical Data:  Evaluate for abdominal aortic aneurysm  CT ANGIOGRAPHY CHEST AND ABDOMEN  Technique:  Multidetector CT imaging through the chest  and abdomen was performed using the standard protocol during bolus administration of intravenous contrast.  Multiplanar reconstructed images  including MIPs were obtained and reviewed to evaluate the vascular anatomy.  Contrast: OMNIPAQUE IOHEXOL 350 MG/ML SOLN  Comparison:   None.  CTA CHEST  Findings:  There is no pleural effusion.  There is moderate centrilobular emphysema.  3 mm calcified granulomas identified in the right upper lobe.  No airspace consolidation identified.  There is no axillary or supraclavicular adenopathy identified. There is no mediastinal or hilar adenopathy.  The thoracic aorta has a normal caliber.  There is no dissection.  The heart size is normal.  No pericardial effusion.  Calcifications are noted within the LAD coronary artery.  There is mild multilevel spondylosis identified within the thoracic spine.  No worrisome bony abnormalities identified.   Review of the MIP images confirms the above findings.  IMPRESSION:  1.  No acute cardiopulmonary abnormalities. 2.  No evidence for aneurysm. 3.  Emphysema.  CTA ABDOMEN AND PELVIS  Findings:  Normal appearance of the liver.  The gallbladder appears normal.  No biliary dilatation.  Normal appearance of the pancreas. Calcified granuloma noted within the spleen.  The adrenal glands are both  normal.  Normal appearance of both kidneys.  There is mild calcified atherosclerotic change involving the abdominal aorta.  No aneurysm.  There is no evidence for aortic dissection.  The stomach, visualized small and large bowel loops appear within normal limits.  No free fluid or fluid collections identified within the abdomen or pelvis.  Review of the visualized osseous structures is significant for mild lumbar spondylosis.   Review of the MIP images confirms the above findings.  IMPRESSION:  1.  No acute findings. 2.  No evidence for abdominal aortic aneurysm or dissection.   Original Report Authenticated By: Signa Kell, M.D.    Scheduled Meds: . sodium chloride  3 mL Intravenous Q12H   Continuous Infusions: . sodium chloride 100 mL/hr at 07/11/13 0355  . pantoprozole  (PROTONIX) infusion 8 mg/hr (07/11/13 0356)    Principal Problem:   GI bleed Active Problems:   Acute blood loss anemia    Time spent: 35 minute    Asako Saliba  [facility] Pager [phone]. If 7PM-7AM, please contact night-coverage at [email or web address], password [facility] 07/11/2013, 3:04 PM  LOS: 1 day

## 2013-07-11 NOTE — Progress Notes (Signed)
Attempted to get report from RN.  Awaiting return call.

## 2013-07-11 NOTE — ED Provider Notes (Signed)
CSN: [id number]     Arrival date & time 07/10/13  2107 History   First MD Initiated Contact with Patient 07/10/13 2247     Chief Complaint  Patient presents with  . abnormal labs    (Consider location/radiation/quality/duration/timing/severity/associated sxs/prior Treatment) HPI 67 year old male presents to emergency department referred from local urgent care with drop in his h and h.  Patient reports over last 2 weeks he has had burning in his back and center of his stomach.  He reports 2 episodes of nausea and vomiting of clear fluid on Friday night.  Patient was seen by his primary care doctor on Monday and had labs sent.  Pain worsened tonight and he went to urgent care.  There it was noted that he had a significant drop in his hemoglobin from 13-10.  They referred him to the emergency room.  Patient reports the pain in his back and abdomen is getting worse and he cannot get comfortable on the bed.  He denies any blood in his stool.  He has no history of aneurysm or aortic problems.  He denies history of gastric ulcer or GERD.  He does take ibuprofen as needed for pain. Past Medical History  Diagnosis Date  . Erectile dysfunction   . Elevated prostate specific antigen (PSA)   . Prostate cancer   . Shortness of breath on exertion    Past Surgical History  Procedure Laterality Date  . Anal fissure repair  AGE 60  . Thoracic fusion  1992      RE-DO T9 - T10 (PRIOR DISKECTOMY IN 1991)  . Radioactive seed implant  06/20/2012    Procedure: RADIOACTIVE SEED IMPLANT;  Surgeon: Kathi Ludwig, MD;  Location: [facility];  Service: Urology;  Laterality: N/A;  83 seeds implanted  . Cystoscopy  06/20/2012    Procedure: CYSTOSCOPY FLEXIBLE;  Surgeon: Kathi Ludwig, MD;  Location: [facility];  Service: Urology;  Laterality: N/A;  no seeds found in bladder  . Cystoscopy  06/20/2012    Procedure: CYSTOSCOPY;  Surgeon: Kathi Ludwig, MD;  Location: [facility];  Service: Urology;;  . Ruptured globe exploration and repair Left 05/12/2013    Procedure: REPAIR OF RUPTURED GLOBE WITH CLOSURE OF LACERATION LOWER EYELID;  Surgeon: Corinda Gubler, MD;  Location: [facility] OR;  Service: Ophthalmology;  Laterality: Left;  . Pars plana vitrectomy Left 05/22/2013    Procedure: PARS PLANA VITRECTOMY WITH 20 GAUGE WITH  ENDO LASER AND SILICONE INJECTION LEFT EYE; Reconstruction Ruptured globe, Drainage subretinal hematoma; Evacuation of anterior chamber clot.;  Surgeon: Edmon Crape, MD;  Location: [facility] OR;  Service: Ophthalmology;  Laterality: Left;  . Photocoagulation with laser Left 05/22/2013    Procedure: PHOTOCOAGULATION WITH LASER;  Surgeon: Edmon Crape, MD;  Location: [facility] OR;  Service: Ophthalmology;  Laterality: Left;  . Injection of silicone oil Left 05/22/2013    Procedure: INJECTION OF SILICONE OIL;  Surgeon: Edmon Crape, MD;  Location: [facility] OR;  Service: Ophthalmology;  Laterality: Left;   Family History  Problem Relation Age of Onset  . Cancer Brother     prostate  . Cancer Sister     breast  . Cancer Brother     colon   History  Substance Use Topics  . Smoking status: Current Every Day Smoker -- 0.25 packs/day for 50 years    Types: Cigarettes  . Smokeless tobacco: Never Used     Comment: PT CUT DOWN  FROM 1 1/2 PPD TO 6 CIG DAILY  . Alcohol Use: No    Review of Systems  All other systems reviewed and are negative.   other than listed in history of present illness  Allergies  Codeine  Home Medications   Current Outpatient Rx  Name  Route  Sig  Dispense  Refill  . albuterol (PROVENTIL HFA;VENTOLIN HFA) 108 (90 BASE) MCG/ACT inhaler   Inhalation   Inhale 2 puffs into the lungs every 6 (six) hours as needed for wheezing.         Marland Kitchen ibuprofen (ADVIL,MOTRIN) 200 MG tablet   Oral   Take 400 mg by mouth every 6 (six) hours as needed for pain.         Marland Kitchen oxyCODONE-acetaminophen (PERCOCET/ROXICET) 5-325 MG per tablet    Oral   Take 2 tablets by mouth every 4 (four) hours as needed for pain.         . prednisoLONE acetate (PRED FORTE) 1 % ophthalmic suspension   Left Eye   Place 1 drop into the left eye 2 (two) times daily as needed (lubrication).           BP 104/68  Pulse 84  Temp(Src) 97.3 F (36.3 C) (Oral)  Resp 16  Wt 155 lb (70.308 kg)  BMI 25.03 kg/m2  SpO2 96% Physical Exam  Vitals reviewed. Constitutional: He is oriented to person, place, and time. He appears well-developed and well-nourished. He appears distressed.  HENT:  Head: Normocephalic and atraumatic.  Nose: Nose normal.  Mouth/Throat: Oropharynx is clear and moist.  Eyes: Conjunctivae and EOM are normal. Pupils are equal, round, and reactive to light.  Neck: Normal range of motion. Neck supple. No JVD present. No tracheal deviation present. No thyromegaly present.  Cardiovascular: Normal rate, regular rhythm, normal heart sounds and intact distal pulses.  Exam reveals no gallop and no friction rub.   No murmur heard. Pulmonary/Chest: Effort normal and breath sounds normal. No stridor. No respiratory distress. He has no wheezes. He has no rales. He exhibits no tenderness.  Abdominal: Soft. Bowel sounds are normal. He exhibits no distension and no mass. There is tenderness (Very mild epigastric tenderness.  No pulsatile mass). There is no rebound and no guarding.  Genitourinary: Guaiac positive stool.  Musculoskeletal: Normal range of motion. He exhibits no edema and no tenderness.  Lymphadenopathy:    He has no cervical adenopathy.  Neurological: He is alert and oriented to person, place, and time. He exhibits normal muscle tone. Coordination normal.  Skin: Skin is warm and dry. No rash noted. No erythema. There is pallor.  Psychiatric: He has a normal mood and affect. His behavior is normal. Judgment and thought content normal.    ED Course  Procedures (including critical care time)  CRITICAL CARE Performed by:  Olivia Mackie Total critical care time: 30 min Critical care time was exclusive of separately billable procedures and treating other patients. Critical care was necessary to treat or prevent imminent or life-threatening deterioration. Critical care was time spent personally by me on the following activities: development of treatment plan with patient and/or surrogate as well as nursing, discussions with consultants, evaluation of patient's response to treatment, examination of patient, obtaining history from patient or surrogate, ordering and performing treatments and interventions, ordering and review of laboratory studies, ordering and review of radiographic studies, pulse oximetry and re-evaluation of patient's condition.  Labs Review Labs Reviewed  CBC - Abnormal; Notable for the following:    RBC  3.16 (*)    Hemoglobin 9.9 (*)    HCT 28.1 (*)    All other components within normal limits  BASIC METABOLIC PANEL - Abnormal; Notable for the following:    Glucose, Bld 150 (*)    BUN 46 (*)    All other components within normal limits  OCCULT BLOOD, POC DEVICE - Abnormal; Notable for the following:    Fecal Occult Bld POSITIVE (*)    All other components within normal limits  URINALYSIS, ROUTINE W REFLEX MICROSCOPIC  POCT I-STAT TROPONIN I   Imaging Review Ct Angio Abdomen W/cm &/or Wo Contrast  07/11/2013   *RADIOLOGY REPORT*  Clinical Data:  Evaluate for abdominal aortic aneurysm  CT ANGIOGRAPHY CHEST AND ABDOMEN  Technique:  Multidetector CT imaging through the chest  and abdomen was performed using the standard protocol during bolus administration of intravenous contrast.  Multiplanar reconstructed images including MIPs were obtained and reviewed to evaluate the vascular anatomy.  Contrast: OMNIPAQUE IOHEXOL 350 MG/ML SOLN  Comparison:   None.  CTA CHEST  Findings:  There is no pleural effusion.  There is moderate centrilobular emphysema.  3 mm calcified granulomas identified in the  right upper lobe.  No airspace consolidation identified.  There is no axillary or supraclavicular adenopathy identified. There is no mediastinal or hilar adenopathy.  The thoracic aorta has a normal caliber.  There is no dissection.  The heart size is normal.  No pericardial effusion.  Calcifications are noted within the LAD coronary artery.  There is mild multilevel spondylosis identified within the thoracic spine.  No worrisome bony abnormalities identified.   Review of the MIP images confirms the above findings.  IMPRESSION:  1.  No acute cardiopulmonary abnormalities. 2.  No evidence for aneurysm. 3.  Emphysema.  CTA ABDOMEN AND PELVIS  Findings:  Normal appearance of the liver.  The gallbladder appears normal.  No biliary dilatation.  Normal appearance of the pancreas. Calcified granuloma noted within the spleen.  The adrenal glands are both normal.  Normal appearance of both kidneys.  There is mild calcified atherosclerotic change involving the abdominal aorta.  No aneurysm.  There is no evidence for aortic dissection.  The stomach, visualized small and large bowel loops appear within normal limits.  No free fluid or fluid collections identified within the abdomen or pelvis.  Review of the visualized osseous structures is significant for mild lumbar spondylosis.   Review of the MIP images confirms the above findings.  IMPRESSION:  1.  No acute findings. 2.  No evidence for abdominal aortic aneurysm or dissection.   Original Report Authenticated By: Signa Kell, M.D.   Ct Angio Chest Aorta W/cm &/or Wo/cm  07/11/2013   *RADIOLOGY REPORT*  Clinical Data:  Evaluate for abdominal aortic aneurysm  CT ANGIOGRAPHY CHEST AND ABDOMEN  Technique:  Multidetector CT imaging through the chest  and abdomen was performed using the standard protocol during bolus administration of intravenous contrast.  Multiplanar reconstructed images including MIPs were obtained and reviewed to evaluate the vascular anatomy.  Contrast:  OMNIPAQUE IOHEXOL 350 MG/ML SOLN  Comparison:   None.  CTA CHEST  Findings:  There is no pleural effusion.  There is moderate centrilobular emphysema.  3 mm calcified granulomas identified in the right upper lobe.  No airspace consolidation identified.  There is no axillary or supraclavicular adenopathy identified. There is no mediastinal or hilar adenopathy.  The thoracic aorta has a normal caliber.  There is no dissection.  The heart  size is normal.  No pericardial effusion.  Calcifications are noted within the LAD coronary artery.  There is mild multilevel spondylosis identified within the thoracic spine.  No worrisome bony abnormalities identified.   Review of the MIP images confirms the above findings.  IMPRESSION:  1.  No acute cardiopulmonary abnormalities. 2.  No evidence for aneurysm. 3.  Emphysema.  CTA ABDOMEN AND PELVIS  Findings:  Normal appearance of the liver.  The gallbladder appears normal.  No biliary dilatation.  Normal appearance of the pancreas. Calcified granuloma noted within the spleen.  The adrenal glands are both normal.  Normal appearance of both kidneys.  There is mild calcified atherosclerotic change involving the abdominal aorta.  No aneurysm.  There is no evidence for aortic dissection.  The stomach, visualized small and large bowel loops appear within normal limits.  No free fluid or fluid collections identified within the abdomen or pelvis.  Review of the visualized osseous structures is significant for mild lumbar spondylosis.   Review of the MIP images confirms the above findings.  IMPRESSION:  1.  No acute findings. 2.  No evidence for abdominal aortic aneurysm or dissection.   Original Report Authenticated By: Signa Kell, M.D.    MDM   1. GI bleed     67 year old male with steadily dropping hemoglobin and hematocrit along with back pain concerns me for possible leaking aneurysm.  Creatinine is good for CT scan of chest abdomen pelvis to further evaluate this.   Patient and family updated on possibility of an aneurysm in his aorta.  He has adequate IV access.  1:42 AM CT scan does not show aneurysm.  There is no specific findings on the CT scan to explain his symptoms.  He is Hemoccult positive.  He may have a gastric or peptic ulcer causing the bleeding, which would explain his upper abdomen, and back pain.  He has been seen before by Dr. [name].  Will consult hospitalist for admission, and GI consult.  2:04 AM D/w Dr Ewing Schlein with GI and hospitalist. Pt and family updated on plan.    Olivia Mackie, MD 07/11/13 [phone]

## 2013-07-11 NOTE — H&P (Signed)
[facility] History and Physical  Brent Phelps:[id number] DOB: [birth year] DOA: 07/10/2013  Referring physician: ER physician. PCP: [name], MD  Specialists: Dr. [name]. Gastroenterologist.  Chief Complaint: Low hemoglobin. Referred from the urgent care.  HPI: Brent Phelps is a 67 y.o. male was referred from the urgent care for patient was found to have a low hemoglobin. Patient has been having some back pain for last 2 weeks and was taking ibuprofen when necessary. Patient had gone to urgent care 2 weeks ago and had basic labs done at that time. History patient became weak and diaphoretic and had gone to urgent care again and at this time repeat labs showed fall in hemoglobin from 13-9. At this time patient was referred to the ER. Stool for occult blood was positive. Patient states he didn't notice any blood in the stools or black stools. Patient has been admitted for further management. On-call gastroenterologist for [facility] Dr. Ewing Schlein was consulted and they will be seeing patient in consult. Patient presently is hemodynamically stable. Since patient is complaining of upper back pain CT angiogram dissection protocol was done by the ER physician which was unremarkable.  Review of Systems: As presented in the history of presenting illness, rest negative.  Past Medical History  Diagnosis Date  . Erectile dysfunction   . Elevated prostate specific antigen (PSA)   . Prostate cancer   . Shortness of breath on exertion    Past Surgical History  Procedure Laterality Date  . Anal fissure repair  AGE 85  . Thoracic fusion  1992      RE-DO T9 - T10 (PRIOR DISKECTOMY IN 1991)  . Radioactive seed implant  06/20/2012    Procedure: RADIOACTIVE SEED IMPLANT;  Surgeon: Kathi Ludwig, MD;  Location: [facility];  Service: Urology;  Laterality: N/A;  83 seeds implanted  . Cystoscopy  06/20/2012    Procedure: CYSTOSCOPY FLEXIBLE;  Surgeon: Kathi Ludwig,  MD;  Location: [facility];  Service: Urology;  Laterality: N/A;  no seeds found in bladder  . Cystoscopy  06/20/2012    Procedure: CYSTOSCOPY;  Surgeon: Kathi Ludwig, MD;  Location: [facility];  Service: Urology;;  . Ruptured globe exploration and repair Left 05/12/2013    Procedure: REPAIR OF RUPTURED GLOBE WITH CLOSURE OF LACERATION LOWER EYELID;  Surgeon: Corinda Gubler, MD;  Location: [facility] OR;  Service: Ophthalmology;  Laterality: Left;  . Pars plana vitrectomy Left 05/22/2013    Procedure: PARS PLANA VITRECTOMY WITH 20 GAUGE WITH  ENDO LASER AND SILICONE INJECTION LEFT EYE; Reconstruction Ruptured globe, Drainage subretinal hematoma; Evacuation of anterior chamber clot.;  Surgeon: Edmon Crape, MD;  Location: [facility] OR;  Service: Ophthalmology;  Laterality: Left;  . Photocoagulation with laser Left 05/22/2013    Procedure: PHOTOCOAGULATION WITH LASER;  Surgeon: Edmon Crape, MD;  Location: [facility] OR;  Service: Ophthalmology;  Laterality: Left;  . Injection of silicone oil Left 05/22/2013    Procedure: INJECTION OF SILICONE OIL;  Surgeon: Edmon Crape, MD;  Location: [facility] OR;  Service: Ophthalmology;  Laterality: Left;   Social History:  reports that he has been smoking Cigarettes.  He has a 12.5 pack-year smoking history. He has never used smokeless tobacco. He reports that he does not drink alcohol or use illicit drugs. Home. where does patient live-- Can do ADLs. Can patient participate in ADLs?  Allergies  Allergen Reactions  . Codeine Nausea And Vomiting    Family History  Problem Relation Age of Onset  . Cancer Brother     prostate  . Cancer Sister     breast  . Cancer Brother     colon      Prior to Admission medications   Medication Sig Start Date End Date Taking? Authorizing Provider  albuterol (PROVENTIL HFA;VENTOLIN HFA) 108 (90 BASE) MCG/ACT inhaler Inhale 2 puffs into the lungs every 6 (six) hours as needed for wheezing.   Yes Historical  Provider, MD  ibuprofen (ADVIL,MOTRIN) 200 MG tablet Take 400 mg by mouth every 6 (six) hours as needed for pain.   Yes Historical Provider, MD  oxyCODONE-acetaminophen (PERCOCET/ROXICET) 5-325 MG per tablet Take 2 tablets by mouth every 4 (four) hours as needed for pain.   Yes Historical Provider, MD  prednisoLONE acetate (PRED FORTE) 1 % ophthalmic suspension Place 1 drop into the left eye 2 (two) times daily as needed (lubrication).    Yes Historical Provider, MD   Physical Exam: Filed Vitals:   07/11/13 0200 07/11/13 0215 07/11/13 0230 07/11/13 0320  BP: 107/71 102/64 113/67 129/76  Pulse: 80 89 84 95  Temp:    98.4 F (36.9 C)  TempSrc:    Oral  Resp:    20  Weight:    70.171 kg (154 lb 11.2 oz)  SpO2: 98% 97% 97% 94%     General:  Well-developed well-nourished.  Eyes: Anicteric no pallor.  ENT: No discharge from the ears eyes nose or mouth.  Neck: No mass felt.  Cardiovascular: S1-S2 heard.  Respiratory: No rhonchi or crepitations.  Abdomen: Soft nontender bowel sounds present.  Skin: No rash.  Musculoskeletal: No edema.  Psychiatric: Appears normal.  Neurologic: Alert awake oriented to time place and person. Moves all extremities.  Labs on Admission:  Basic Metabolic Panel:  Recent Labs Lab 07/10/13 2137  NA 135  K 3.8  CL 103  CO2 23  GLUCOSE 150*  BUN 46*  CREATININE 0.73  CALCIUM 9.0   Liver Function Tests: No results found for this basename: AST, ALT, ALKPHOS, BILITOT, PROT, ALBUMIN,  in the last 168 hours No results found for this basename: LIPASE, AMYLASE,  in the last 168 hours No results found for this basename: AMMONIA,  in the last 168 hours CBC:  Recent Labs Lab 07/10/13 2137  WBC 7.5  HGB 9.9*  HCT 28.1*  MCV 88.9  PLT 186   Cardiac Enzymes: No results found for this basename: CKTOTAL, CKMB, CKMBINDEX, TROPONINI,  in the last 168 hours  BNP (last 3 results) No results found for this basename: PROBNP,  in the last 8760  hours CBG: No results found for this basename: GLUCAP,  in the last 168 hours  Radiological Exams on Admission: Ct Angio Abdomen W/cm &/or Wo Contrast  07/11/2013   *RADIOLOGY REPORT*  Clinical Data:  Evaluate for abdominal aortic aneurysm  CT ANGIOGRAPHY CHEST AND ABDOMEN  Technique:  Multidetector CT imaging through the chest  and abdomen was performed using the standard protocol during bolus administration of intravenous contrast.  Multiplanar reconstructed images including MIPs were obtained and reviewed to evaluate the vascular anatomy.  Contrast: OMNIPAQUE IOHEXOL 350 MG/ML SOLN  Comparison:   None.  CTA CHEST  Findings:  There is no pleural effusion.  There is moderate centrilobular emphysema.  3 mm calcified granulomas identified in the right upper lobe.  No airspace consolidation identified.  There is no axillary or supraclavicular adenopathy identified. There is no mediastinal or hilar adenopathy.  The  thoracic aorta has a normal caliber.  There is no dissection.  The heart size is normal.  No pericardial effusion.  Calcifications are noted within the LAD coronary artery.  There is mild multilevel spondylosis identified within the thoracic spine.  No worrisome bony abnormalities identified.   Review of the MIP images confirms the above findings.  IMPRESSION:  1.  No acute cardiopulmonary abnormalities. 2.  No evidence for aneurysm. 3.  Emphysema.  CTA ABDOMEN AND PELVIS  Findings:  Normal appearance of the liver.  The gallbladder appears normal.  No biliary dilatation.  Normal appearance of the pancreas. Calcified granuloma noted within the spleen.  The adrenal glands are both normal.  Normal appearance of both kidneys.  There is mild calcified atherosclerotic change involving the abdominal aorta.  No aneurysm.  There is no evidence for aortic dissection.  The stomach, visualized small and large bowel loops appear within normal limits.  No free fluid or fluid collections identified within the  abdomen or pelvis.  Review of the visualized osseous structures is significant for mild lumbar spondylosis.   Review of the MIP images confirms the above findings.  IMPRESSION:  1.  No acute findings. 2.  No evidence for abdominal aortic aneurysm or dissection.   Original Report Authenticated By: Signa Kell, M.D.   Ct Angio Chest Aorta W/cm &/or Wo/cm  07/11/2013   *RADIOLOGY REPORT*  Clinical Data:  Evaluate for abdominal aortic aneurysm  CT ANGIOGRAPHY CHEST AND ABDOMEN  Technique:  Multidetector CT imaging through the chest  and abdomen was performed using the standard protocol during bolus administration of intravenous contrast.  Multiplanar reconstructed images including MIPs were obtained and reviewed to evaluate the vascular anatomy.  Contrast: OMNIPAQUE IOHEXOL 350 MG/ML SOLN  Comparison:   None.  CTA CHEST  Findings:  There is no pleural effusion.  There is moderate centrilobular emphysema.  3 mm calcified granulomas identified in the right upper lobe.  No airspace consolidation identified.  There is no axillary or supraclavicular adenopathy identified. There is no mediastinal or hilar adenopathy.  The thoracic aorta has a normal caliber.  There is no dissection.  The heart size is normal.  No pericardial effusion.  Calcifications are noted within the LAD coronary artery.  There is mild multilevel spondylosis identified within the thoracic spine.  No worrisome bony abnormalities identified.   Review of the MIP images confirms the above findings.  IMPRESSION:  1.  No acute cardiopulmonary abnormalities. 2.  No evidence for aneurysm. 3.  Emphysema.  CTA ABDOMEN AND PELVIS  Findings:  Normal appearance of the liver.  The gallbladder appears normal.  No biliary dilatation.  Normal appearance of the pancreas. Calcified granuloma noted within the spleen.  The adrenal glands are both normal.  Normal appearance of both kidneys.  There is mild calcified atherosclerotic change involving the abdominal  aorta.  No aneurysm.  There is no evidence for aortic dissection.  The stomach, visualized small and large bowel loops appear within normal limits.  No free fluid or fluid collections identified within the abdomen or pelvis.  Review of the visualized osseous structures is significant for mild lumbar spondylosis.   Review of the MIP images confirms the above findings.  IMPRESSION:  1.  No acute findings. 2.  No evidence for abdominal aortic aneurysm or dissection.   Original Report Authenticated By: Signa Kell, M.D.    EKG: Sinus tachycardia.  Assessment/Plan Principal Problem:   GI bleed Active Problems:   Acute blood loss  anemia   1. Acute GI bleed - most likely upper GI bleed considering patient was taking ibuprofen. Presently patient is hemodynamically stable. Recheck CBC. Transfuse if there is hemoglobin less than 7 or patient becomes hemodynamically unstable. Protonix infusion. Patient states he has had colonoscopy earlier part of this year which as per the patient was normal. 2. Acute blood loss anemia - closely follow CBC. 3. History of prostate cancer. 4. Recent eye trauma status post surgery.    Code Status: Full code.  Family Communication: None.  Disposition Plan: Admit to inpatient.    Fayez Sturgell N. [facility] Pager [phone].  If 7PM-7AM, please contact night-coverage [email or web address] Password [facility] 07/11/2013, 3:22 AM

## 2013-07-11 NOTE — ED Notes (Signed)
Patient transported to CT 

## 2013-07-11 NOTE — Op Note (Signed)
[name] [address] [place], [address]   ENDOSCOPY PROCEDURE REPORT  PATIENT: Brent Phelps, Brent Phelps  MR#: [id number] BIRTHDATE: 1946-04-16 , 67  yrs. old GENDER: Male ENDOSCOPIST:Lanisa Ishler [name], MD REFERRED BY:  [facility]. PCP: Dr. Manus Gunning PROCEDURE DATE:  07/11/2013 PROCEDURE:       EGD with biopsy ASA CLASS:     class 2 INDICATIONS:  G.I. bleeding right upper quadrant pain MEDICATION:   fentanyl 50 mcg, versed 5 mg IV TOPICAL ANESTHETIC:  cetacaine spray  DESCRIPTION OF PROCEDURE:   The Pentax adult endoscope was inserted blindly into the esophagus with swallowing. The esophagus was carefully examined. EGD junction was widely patent. There was a moderate size hiatal hernia. The stomach was hundred and examined in the forward and retroflex view. There was no active bleeding or sign of recent bleeding. There were no ulcerations seen. Pyloric channel was normal. Immediately upon passing into the duodenal bulb, a large ulcer was seen on the anterior duodenal wall of the vessel in the middle. There was no active bleeding in no clot. It was clean based. The 2nd duodenal is normal. The scope was withdrawn in the patient tolerated procedure well.     COMPLICATIONS: None  ENDOSCOPIC IMPRESSION: 1. Large Duodenal Ulcer. Not actively bleeding at this time.  RECOMMENDATIONS: 1. would continue to treat with aggressive PPI therapy. Would people clear liquids in case patient bleeds again and needs urgent EGD with therapeutic maneuvers.    _______________________________ Rosalie DoctorCarman Ching, MD 07/11/2013 10:17 AM  CC: Dr Manus Gunning

## 2013-07-11 NOTE — Consult Note (Signed)
[facility] Reason for consult: GI Bleeding Referring Physician: [facility]. PCP: Dr Ehinger. Primary G.I.: Dr. Loralyn Rachel  Brent Phelps is an 67 y.o. male.  HPI: he is well-known to me. He has routine colonoscopies because of a family history of colon cancer in his brother. His last colonoscopy was one/13 and was negative other than internal hemorrhoids. The patient reports that he has had back pain and right upper quadrant/right flank pain for several weeks and has been taking ibuprofen. He is a bit vague on whether or not he has had dark stool. He has become progressively weaker and presented to the emergency room. His hemoglobin was 9.9 and stools were positive for FOB. It is subsequently dropped 8.1. Patient denies any gross hematochezia. He has not been taking any medication either prescription or OTC or abdominal pain or acid. He has been taking ibuprofen as well as oxycodone.  Past Medical History  Diagnosis Date  . Erectile dysfunction   . Elevated prostate specific antigen (PSA)   . Prostate cancer   . Shortness of breath on exertion     Past Surgical History  Procedure Laterality Date  . Anal fissure repair  AGE 33  . Thoracic fusion  1992      RE-DO T9 - T10 (PRIOR DISKECTOMY IN 1991)  . Radioactive seed implant  06/20/2012    Procedure: RADIOACTIVE SEED IMPLANT;  Surgeon: Sigmund I Tannenbaum, MD;  Location: [facility];  Service: Urology;  Laterality: N/A;  83 seeds implanted  . Cystoscopy  06/20/2012    Procedure: CYSTOSCOPY FLEXIBLE;  Surgeon: Sigmund I Tannenbaum, MD;  Location: [facility];  Service: Urology;  Laterality: N/A;  no seeds found in bladder  . Cystoscopy  06/20/2012    Procedure: CYSTOSCOPY;  Surgeon: Sigmund I Tannenbaum, MD;  Location: [facility];  Service: Urology;;  . Ruptured globe exploration and repair Left 05/12/2013    Procedure: REPAIR OF RUPTURED GLOBE WITH CLOSURE OF LACERATION  LOWER EYELID;  Surgeon: Michael A Spencer, MD;  Location: [facility];  Service: Ophthalmology;  Laterality: Left;  . Pars plana vitrectomy Left 05/22/2013    Procedure: PARS PLANA VITRECTOMY WITH 20 GAUGE WITH  ENDO LASER AND SILICONE INJECTION LEFT EYE; Reconstruction Ruptured globe, Drainage subretinal hematoma; Evacuation of anterior chamber clot.;  Surgeon: Gary A Rankin, MD;  Location: [facility];  Service: Ophthalmology;  Laterality: Left;  . Photocoagulation with laser Left 05/22/2013    Procedure: PHOTOCOAGULATION WITH LASER;  Surgeon: Gary A Rankin, MD;  Location: [facility];  Service: Ophthalmology;  Laterality: Left;  . Injection of silicone oil Left 05/22/2013    Procedure: INJECTION OF SILICONE OIL;  Surgeon: Gary A Rankin, MD;  Location: [facility];  Service: Ophthalmology;  Laterality: Left;    Family History  Problem Relation Age of Onset  . Cancer Brother     prostate  . Cancer Sister     breast  . Cancer Brother     colon    Social History:  reports that he has been smoking Cigarettes.  He has a 12.5 pack-year smoking history. He has never used smokeless tobacco. He reports that he does not drink alcohol or use illicit drugs.  Allergies:  Allergies  Allergen Reactions  . Codeine Nausea And Vomiting    Medications; Prior to Admission medications   Medication Sig Start Date End Date Taking? Authorizing Provider  albuterol (PROVENTIL HFA;VENTOLIN HFA) 108 (90 BASE) MCG/ACT inhaler Inhale 2 puffs into the   lungs every 6 (six) hours as needed for wheezing.   Yes Historical Provider, MD  ibuprofen (ADVIL,MOTRIN) 200 MG tablet Take 400 mg by mouth every 6 (six) hours as needed for pain.   Yes Historical Provider, MD  oxyCODONE-acetaminophen (PERCOCET/ROXICET) 5-325 MG per tablet Take 2 tablets by mouth every 4 (four) hours as needed for pain.   Yes Historical Provider, MD  prednisoLONE acetate (PRED FORTE) 1 % ophthalmic suspension Place 1 drop into the left eye 2 (two) times daily as needed  (lubrication).    Yes Historical Provider, MD   . sodium chloride  3 mL Intravenous Q12H   PRN Meds acetaminophen, acetaminophen, albuterol, ondansetron (ZOFRAN) IV, ondansetron, prednisoLONE acetate Results for orders placed during the hospital encounter of 07/10/13 (from the past 48 hour(s))  CBC     Status: Abnormal   Collection Time    07/10/13  9:37 PM      Result Value Range   WBC 7.5  4.0 - 10.5 K/uL   RBC 3.16 (*) 4.22 - 5.81 MIL/uL   Hemoglobin 9.9 (*) 13.0 - 17.0 g/dL   HCT 28.1 (*) 39.0 - 52.0 %   MCV 88.9  78.0 - 100.0 fL   MCH 31.3  26.0 - 34.0 pg   MCHC 35.2  30.0 - 36.0 g/dL   RDW 12.7  11.5 - 15.5 %   Platelets 186  150 - 400 K/uL  BASIC METABOLIC PANEL     Status: Abnormal   Collection Time    07/10/13  9:37 PM      Result Value Range   Sodium 135  135 - 145 mEq/L   Potassium 3.8  3.5 - 5.1 mEq/L   Chloride 103  96 - 112 mEq/L   CO2 23  19 - 32 mEq/L   Glucose, Bld 150 (*) 70 - 99 mg/dL   BUN 46 (*) 6 - 23 mg/dL   Creatinine, Ser 0.73  0.50 - 1.35 mg/dL   Calcium 9.0  8.4 - 10.5 mg/dL   GFR calc non Af Amer >90  >90 mL/min   GFR calc Af Amer >90  >90 mL/min   Comment: (NOTE)     The eGFR has been calculated using the CKD EPI equation.     This calculation has not been validated in all clinical situations.     eGFR's persistently <90 mL/min signify possible Chronic Kidney     Disease.  POCT I-STAT TROPONIN I     Status: None   Collection Time    07/10/13  9:48 PM      Result Value Range   Troponin i, poc 0.02  0.00 - 0.08 ng/mL   Comment 3            Comment: Due to the release kinetics of cTnI,     a negative result within the first hours     of the onset of symptoms does not rule out     myocardial infarction with certainty.     If myocardial infarction is still suspected,     repeat the test at appropriate intervals.  URINALYSIS, ROUTINE W REFLEX MICROSCOPIC     Status: None   Collection Time    07/11/13 12:17 AM      Result Value Range    Color, Urine YELLOW  YELLOW   APPearance CLEAR  CLEAR   Specific Gravity, Urine 1.026  1.005 - 1.030   pH 5.0  5.0 - 8.0   Glucose, UA NEGATIVE    NEGATIVE mg/dL   Hgb urine dipstick NEGATIVE  NEGATIVE   Bilirubin Urine NEGATIVE  NEGATIVE   Ketones, ur NEGATIVE  NEGATIVE mg/dL   Protein, ur NEGATIVE  NEGATIVE mg/dL   Urobilinogen, UA 0.2  0.0 - 1.0 mg/dL   Nitrite NEGATIVE  NEGATIVE   Leukocytes, UA NEGATIVE  NEGATIVE   Comment: MICROSCOPIC NOT DONE ON URINES WITH NEGATIVE PROTEIN, BLOOD, LEUKOCYTES, NITRITE, OR GLUCOSE <1000 mg/dL.  OCCULT BLOOD, POC DEVICE     Status: Abnormal   Collection Time    07/11/13  1:26 AM      Result Value Range   Fecal Occult Bld POSITIVE (*) NEGATIVE  CBC     Status: Abnormal   Collection Time    07/11/13  4:30 AM      Result Value Range   WBC 4.7  4.0 - 10.5 K/uL   RBC 2.59 (*) 4.22 - 5.81 MIL/uL   Hemoglobin 8.1 (*) 13.0 - 17.0 g/dL   Comment: REPEATED TO VERIFY   HCT 22.9 (*) 39.0 - 52.0 %   MCV 88.4  78.0 - 100.0 fL   MCH 31.3  26.0 - 34.0 pg   MCHC 35.4  30.0 - 36.0 g/dL   RDW 12.7  11.5 - 15.5 %   Platelets 169  150 - 400 K/uL  COMPREHENSIVE METABOLIC PANEL     Status: Abnormal   Collection Time    07/11/13  4:30 AM      Result Value Range   Sodium 137  135 - 145 mEq/L   Potassium 3.8  3.5 - 5.1 mEq/L   Chloride 105  96 - 112 mEq/L   CO2 25  19 - 32 mEq/L   Glucose, Bld 110 (*) 70 - 99 mg/dL   BUN 40 (*) 6 - 23 mg/dL   Creatinine, Ser 0.70  0.50 - 1.35 mg/dL   Calcium 8.1 (*) 8.4 - 10.5 mg/dL   Total Protein 5.3 (*) 6.0 - 8.3 g/dL   Albumin 2.9 (*) 3.5 - 5.2 g/dL   AST 11  0 - 37 U/L   ALT 8  0 - 53 U/L   Alkaline Phosphatase 44  39 - 117 U/L   Total Bilirubin 0.4  0.3 - 1.2 mg/dL   GFR calc non Af Amer >90  >90 mL/min   GFR calc Af Amer >90  >90 mL/min   Comment: (NOTE)     The eGFR has been calculated using the CKD EPI equation.     This calculation has not been validated in all clinical situations.     eGFR's persistently  <90 mL/min signify possible Chronic Kidney     Disease.  TROPONIN I     Status: None   Collection Time    07/11/13  4:30 AM      Result Value Range   Troponin I <0.30  <0.30 ng/mL   Comment:            Due to the release kinetics of cTnI,     a negative result within the first hours     of the onset of symptoms does not rule out     myocardial infarction with certainty.     If myocardial infarction is still suspected,     repeat the test at appropriate intervals.  TYPE AND SCREEN     Status: None   Collection Time    07/11/13  6:50 AM      Result Value Range   ABO/RH(D) O POS       Antibody Screen NEG     Sample Expiration 07/14/2013      Ct Angio Abdomen W/cm &/or Wo Contrast  07/11/2013   *RADIOLOGY REPORT*  Clinical Data:  Evaluate for abdominal aortic aneurysm  CT ANGIOGRAPHY CHEST AND ABDOMEN  Technique:  Multidetector CT imaging through the chest  and abdomen was performed using the standard protocol during bolus administration of intravenous contrast.  Multiplanar reconstructed images including MIPs were obtained and reviewed to evaluate the vascular anatomy.  Contrast: 100mL OMNIPAQUE IOHEXOL 350 MG/ML SOLN  Comparison:   None.  CTA CHEST  Findings:  There is no pleural effusion.  There is moderate centrilobular emphysema.  3 mm calcified granulomas identified in the right upper lobe.  No airspace consolidation identified.  There is no axillary or supraclavicular adenopathy identified. There is no mediastinal or hilar adenopathy.  The thoracic aorta has a normal caliber.  There is no dissection.  The heart size is normal.  No pericardial effusion.  Calcifications are noted within the LAD coronary artery.  There is mild multilevel spondylosis identified within the thoracic spine.  No worrisome bony abnormalities identified.   Review of the MIP images confirms the above findings.  IMPRESSION:  1.  No acute cardiopulmonary abnormalities. 2.  No evidence for aneurysm. 3.  Emphysema.  CTA  ABDOMEN AND PELVIS  Findings:  Normal appearance of the liver.  The gallbladder appears normal.  No biliary dilatation.  Normal appearance of the pancreas. Calcified granuloma noted within the spleen.  The adrenal glands are both normal.  Normal appearance of both kidneys.  There is mild calcified atherosclerotic change involving the abdominal aorta.  No aneurysm.  There is no evidence for aortic dissection.  The stomach, visualized small and large bowel loops appear within normal limits.  No free fluid or fluid collections identified within the abdomen or pelvis.  Review of the visualized osseous structures is significant for mild lumbar spondylosis.   Review of the MIP images confirms the above findings.  IMPRESSION:  1.  No acute findings. 2.  No evidence for abdominal aortic aneurysm or dissection.   Original Report Authenticated By: Taylor Stroud, M.D.   Ct Angio Chest Aorta W/cm &/or Wo/cm  07/11/2013   *RADIOLOGY REPORT*  Clinical Data:  Evaluate for abdominal aortic aneurysm  CT ANGIOGRAPHY CHEST AND ABDOMEN  Technique:  Multidetector CT imaging through the chest  and abdomen was performed using the standard protocol during bolus administration of intravenous contrast.  Multiplanar reconstructed images including MIPs were obtained and reviewed to evaluate the vascular anatomy.  Contrast: 100mL OMNIPAQUE IOHEXOL 350 MG/ML SOLN  Comparison:   None.  CTA CHEST  Findings:  There is no pleural effusion.  There is moderate centrilobular emphysema.  3 mm calcified granulomas identified in the right upper lobe.  No airspace consolidation identified.  There is no axillary or supraclavicular adenopathy identified. There is no mediastinal or hilar adenopathy.  The thoracic aorta has a normal caliber.  There is no dissection.  The heart size is normal.  No pericardial effusion.  Calcifications are noted within the LAD coronary artery.  There is mild multilevel spondylosis identified within the thoracic spine.  No  worrisome bony abnormalities identified.   Review of the MIP images confirms the above findings.  IMPRESSION:  1.  No acute cardiopulmonary abnormalities. 2.  No evidence for aneurysm. 3.  Emphysema.  CTA ABDOMEN AND PELVIS  Findings:  Normal appearance of the liver.  The gallbladder appears normal.  No biliary   dilatation.  Normal appearance of the pancreas. Calcified granuloma noted within the spleen.  The adrenal glands are both normal.  Normal appearance of both kidneys.  There is mild calcified atherosclerotic change involving the abdominal aorta.  No aneurysm.  There is no evidence for aortic dissection.  The stomach, visualized small and large bowel loops appear within normal limits.  No free fluid or fluid collections identified within the abdomen or pelvis.  Review of the visualized osseous structures is significant for mild lumbar spondylosis.   Review of the MIP images confirms the above findings.  IMPRESSION:  1.  No acute findings. 2.  No evidence for abdominal aortic aneurysm or dissection.   Original Report Authenticated By: Taylor Stroud, M.D.               Blood pressure 130/63, pulse 84, temperature 97.9 F (36.6 C), temperature source Oral, resp. rate 18, weight 70.171 kg (154 lb 11.2 oz), SpO2 98.00%.  Physical exam:   General-- pleasant Caucasian male in no acute distress Heart-- regular rate and rhythm without murmurs are gallops Lungs--clear Abdomen-- none distended in soft with mild tenderness primarily in the right flank area   Assessment: 1. Right upper quadrant pain/G.I. bleeding. In view of patient's ibuprofen use in the NSAID induced ulcer most likely explanation.  Plan: 1. We will plan on going ahead with EGD later this morning. Have discussed this with the patient and he is agreeable.     Cailen Texeira JR,Arael Piccione L 07/11/2013, 9:22 AM      

## 2013-07-12 LAB — GLUCOSE, CAPILLARY
Glucose-Capillary: 86 mg/dL (ref 70–99)
Glucose-Capillary: 83 mg/dL (ref 70–99)

## 2013-07-12 LAB — CBC
HCT: 22.3 % — ABNORMAL LOW (ref 39.0–52.0)
Hemoglobin: 8.1 g/dL — ABNORMAL LOW (ref 13.0–17.0)
MCHC: 36.3 g/dL — ABNORMAL HIGH (ref 30.0–36.0)
RBC: 2.53 MIL/uL — ABNORMAL LOW (ref 4.22–5.81)
WBC: 3.7 10*3/uL — ABNORMAL LOW (ref 4.0–10.5)

## 2013-07-12 LAB — TYPE AND SCREEN: Unit division: 0

## 2013-07-12 MED ORDER — PANTOPRAZOLE SODIUM 40 MG PO TBEC
40.0000 mg | DELAYED_RELEASE_TABLET | Freq: Two times a day (BID) | ORAL | Status: DC
  Administered 2013-07-12 – 2013-07-14 (×4): 40 mg via ORAL
  Filled 2013-07-12 (×4): qty 1

## 2013-07-12 NOTE — Progress Notes (Signed)
[facility] PROGRESS NOTE  Brent Phelps ZOX:[id number] DOB: 1946/07/10 DOA: 07/10/2013 PCP: [name], MD  Assessment/Plan: 1. GI Bleed - Gastroenterologist on board and making further recommendations. Changing protonix to oral route and feeding patient.   - will f/u on their recommendations.  2. ABLA - Will reassess cbc next am - if hgb less than 7.9 will plan on transfusing  - no active bleeding reported.  Code Status: full Family Communication: Discussed with patient at bedside.  Disposition Plan: Pending cessation of GI bleed. Will await disposition plans from specialist involved.   Consultants:  GI  Procedures:  Upper endoscopy 07/11/13  Antibiotics:  none  HPI/Subjective: No acute issues reported overnight. No new complaints.  Objective: Filed Vitals:   07/12/13 1800  BP: 102/62  Pulse: 78  Temp: 98.6 F (37 C)  Resp: 18    Intake/Output Summary (Last 24 hours) at 07/12/13 2007 Last data filed at 07/12/13 1700  Gross per 24 hour  Intake    840 ml  Output   1250 ml  Net   -410 ml   Filed Weights   07/10/13 2124 07/11/13 0320 07/12/13 0609  Weight: 70.308 kg (155 lb) 70.171 kg (154 lb 11.2 oz) 72.394 kg (159 lb 9.6 oz)    Exam:   General:  Pt in NAD, Alert and Awake  Cardiovascular: RRR, no MRG  Respiratory: CTA BL, no wheezes  Abdomen: soft, NT, ND  Musculoskeletal: no cyanosis or clubbing   Data Reviewed: Basic Metabolic Panel:  Recent Labs Lab 07/10/13 2137 07/11/13 0430  NA 135 137  K 3.8 3.8  CL 103 105  CO2 23 25  GLUCOSE 150* 110*  BUN 46* 40*  CREATININE 0.73 0.70  CALCIUM 9.0 8.1*   Liver Function Tests:  Recent Labs Lab 07/11/13 0430  AST 11  ALT 8  ALKPHOS 44  BILITOT 0.4  PROT 5.3*  ALBUMIN 2.9*   No results found for this basename: LIPASE, AMYLASE,  in the last 168 hours No results found for this basename: AMMONIA,  in the last 168 hours CBC:  Recent Labs Lab 07/10/13 2137  07/11/13 0430 07/11/13 0910 07/11/13 1401 07/12/13 0605  WBC 7.5 4.7 5.0 5.4 3.7*  HGB 9.9* 8.1* 8.0* 7.6* 8.1*  HCT 28.1* 22.9* 22.1* 21.5* 22.3*  MCV 88.9 88.4 88.4 88.5 88.1  PLT 186 169 147* 142* 257   Cardiac Enzymes:  Recent Labs Lab 07/11/13 0430  TROPONINI <0.30   BNP (last 3 results) No results found for this basename: PROBNP,  in the last 8760 hours CBG:  Recent Labs Lab 07/11/13 1726 07/12/13 0048 07/12/13 0608 07/12/13 1315 07/12/13 1710  GLUCAP 109* 87 86 83 100*    No results found for this or any previous visit (from the past 240 hour(s)).   Studies: Ct Angio Abdomen W/cm &/or Wo Contrast  07/11/2013   *RADIOLOGY REPORT*  Clinical Data:  Evaluate for abdominal aortic aneurysm  CT ANGIOGRAPHY CHEST AND ABDOMEN  Technique:  Multidetector CT imaging through the chest  and abdomen was performed using the standard protocol during bolus administration of intravenous contrast.  Multiplanar reconstructed images including MIPs were obtained and reviewed to evaluate the vascular anatomy.  Contrast: OMNIPAQUE IOHEXOL 350 MG/ML SOLN  Comparison:   None.  CTA CHEST  Findings:  There is no pleural effusion.  There is moderate centrilobular emphysema.  3 mm calcified granulomas identified in the right upper lobe.  No airspace consolidation identified.  There is no axillary  or supraclavicular adenopathy identified. There is no mediastinal or hilar adenopathy.  The thoracic aorta has a normal caliber.  There is no dissection.  The heart size is normal.  No pericardial effusion.  Calcifications are noted within the LAD coronary artery.  There is mild multilevel spondylosis identified within the thoracic spine.  No worrisome bony abnormalities identified.   Review of the MIP images confirms the above findings.  IMPRESSION:  1.  No acute cardiopulmonary abnormalities. 2.  No evidence for aneurysm. 3.  Emphysema.  CTA ABDOMEN AND PELVIS  Findings:  Normal appearance of the liver.   The gallbladder appears normal.  No biliary dilatation.  Normal appearance of the pancreas. Calcified granuloma noted within the spleen.  The adrenal glands are both normal.  Normal appearance of both kidneys.  There is mild calcified atherosclerotic change involving the abdominal aorta.  No aneurysm.  There is no evidence for aortic dissection.  The stomach, visualized small and large bowel loops appear within normal limits.  No free fluid or fluid collections identified within the abdomen or pelvis.  Review of the visualized osseous structures is significant for mild lumbar spondylosis.   Review of the MIP images confirms the above findings.  IMPRESSION:  1.  No acute findings. 2.  No evidence for abdominal aortic aneurysm or dissection.   Original Report Authenticated By: Signa Kell, M.D.   Ct Angio Chest Aorta W/cm &/or Wo/cm  07/11/2013   *RADIOLOGY REPORT*  Clinical Data:  Evaluate for abdominal aortic aneurysm  CT ANGIOGRAPHY CHEST AND ABDOMEN  Technique:  Multidetector CT imaging through the chest  and abdomen was performed using the standard protocol during bolus administration of intravenous contrast.  Multiplanar reconstructed images including MIPs were obtained and reviewed to evaluate the vascular anatomy.  Contrast: OMNIPAQUE IOHEXOL 350 MG/ML SOLN  Comparison:   None.  CTA CHEST  Findings:  There is no pleural effusion.  There is moderate centrilobular emphysema.  3 mm calcified granulomas identified in the right upper lobe.  No airspace consolidation identified.  There is no axillary or supraclavicular adenopathy identified. There is no mediastinal or hilar adenopathy.  The thoracic aorta has a normal caliber.  There is no dissection.  The heart size is normal.  No pericardial effusion.  Calcifications are noted within the LAD coronary artery.  There is mild multilevel spondylosis identified within the thoracic spine.  No worrisome bony abnormalities identified.   Review of the MIP images  confirms the above findings.  IMPRESSION:  1.  No acute cardiopulmonary abnormalities. 2.  No evidence for aneurysm. 3.  Emphysema.  CTA ABDOMEN AND PELVIS  Findings:  Normal appearance of the liver.  The gallbladder appears normal.  No biliary dilatation.  Normal appearance of the pancreas. Calcified granuloma noted within the spleen.  The adrenal glands are both normal.  Normal appearance of both kidneys.  There is mild calcified atherosclerotic change involving the abdominal aorta.  No aneurysm.  There is no evidence for aortic dissection.  The stomach, visualized small and large bowel loops appear within normal limits.  No free fluid or fluid collections identified within the abdomen or pelvis.  Review of the visualized osseous structures is significant for mild lumbar spondylosis.   Review of the MIP images confirms the above findings.  IMPRESSION:  1.  No acute findings. 2.  No evidence for abdominal aortic aneurysm or dissection.   Original Report Authenticated By: Signa Kell, M.D.    Scheduled Meds: . pantoprazole  40 mg Oral BID AC  . sodium chloride  3 mL Intravenous Q12H   Continuous Infusions: . pantoprozole (PROTONIX) infusion 8 mg/hr (07/12/13 0826)    Principal Problem:   GI bleed Active Problems:   Acute blood loss anemia    Time spent: > 35 minutes    Penny Pia  [facility] Pager [phone]. If 7PM-7AM, please contact night-coverage at [email or web address], password [facility] 07/12/2013, 8:07 PM  LOS: 2 days

## 2013-07-12 NOTE — Progress Notes (Signed)
[facility] PROGRESS NOTE Subjective No further gross bleeding. Hungry.  Objective: Vital signs in last 24 hours: Temp:  [97.9 F (36.6 C)-98.4 F (36.9 C)] 98.2 F (36.8 C) (09/13 0532) Pulse Rate:  [75-83] 79 (09/13 0532) Resp:  [16-20] 18 (09/13 0532) BP: (95-103)/(61-71) 99/63 mmHg (09/13 0532) SpO2:  [94 %-97 %] 96 % (09/13 0532) Weight:  [72.394 kg (159 lb 9.6 oz)] 72.394 kg (159 lb 9.6 oz) (09/13 0609) Last BM Date: 07/10/13  Intake/Output from previous day: 09/12 0701 - 09/13 0700 In: 1749.5 [P.O.:600; I.V.:800; Blood:349.5] Out: 1275 [Urine:1275] Intake/Output this shift:    PE: G Abdomen--nontender  Lab Results:  Recent Labs  07/10/13 2137 07/11/13 0430 07/11/13 0910 07/11/13 1401 07/12/13 0605  WBC 7.5 4.7 5.0 5.4 3.7*  HGB 9.9* 8.1* 8.0* 7.6* 8.1*  HCT 28.1* 22.9* 22.1* 21.5* 22.3*  PLT 186 169 147* 142* 257   BMET  Recent Labs  07/10/13 2137 07/11/13 0430  NA 135 137  K 3.8 3.8  CL 103 105  CO2 23 25  CREATININE 0.73 0.70   LFT  Recent Labs  07/11/13 0430  PROT 5.3*  AST 11  ALT 8  ALKPHOS 44  BILITOT 0.4   PT/INR No results found for this basename: LABPROT, INR,  in the last 72 hours PANCREAS No results found for this basename: LIPASE,  in the last 72 hours       Studies/Results: Ct Angio Abdomen W/cm &/or Wo Contrast  07/11/2013   *RADIOLOGY REPORT*  Clinical Data:  Evaluate for abdominal aortic aneurysm  CT ANGIOGRAPHY CHEST AND ABDOMEN  Technique:  Multidetector CT imaging through the chest  and abdomen was performed using the standard protocol during bolus administration of intravenous contrast.  Multiplanar reconstructed images including MIPs were obtained and reviewed to evaluate the vascular anatomy.  Contrast: OMNIPAQUE IOHEXOL 350 MG/ML SOLN  Comparison:   None.  CTA CHEST  Findings:  There is no pleural effusion.  There is moderate centrilobular emphysema.  3 mm calcified granulomas identified in the  right upper lobe.  No airspace consolidation identified.  There is no axillary or supraclavicular adenopathy identified. There is no mediastinal or hilar adenopathy.  The thoracic aorta has a normal caliber.  There is no dissection.  The heart size is normal.  No pericardial effusion.  Calcifications are noted within the LAD coronary artery.  There is mild multilevel spondylosis identified within the thoracic spine.  No worrisome bony abnormalities identified.   Review of the MIP images confirms the above findings.  IMPRESSION:  1.  No acute cardiopulmonary abnormalities. 2.  No evidence for aneurysm. 3.  Emphysema.  CTA ABDOMEN AND PELVIS  Findings:  Normal appearance of the liver.  The gallbladder appears normal.  No biliary dilatation.  Normal appearance of the pancreas. Calcified granuloma noted within the spleen.  The adrenal glands are both normal.  Normal appearance of both kidneys.  There is mild calcified atherosclerotic change involving the abdominal aorta.  No aneurysm.  There is no evidence for aortic dissection.  The stomach, visualized small and large bowel loops appear within normal limits.  No free fluid or fluid collections identified within the abdomen or pelvis.  Review of the visualized osseous structures is significant for mild lumbar spondylosis.   Review of the MIP images confirms the above findings.  IMPRESSION:  1.  No acute findings. 2.  No evidence for abdominal aortic aneurysm or dissection.   Original Report Authenticated By: Signa Kell, M.D.  Ct Angio Chest Aorta W/cm &/or Wo/cm  07/11/2013   *RADIOLOGY REPORT*  Clinical Data:  Evaluate for abdominal aortic aneurysm  CT ANGIOGRAPHY CHEST AND ABDOMEN  Technique:  Multidetector CT imaging through the chest  and abdomen was performed using the standard protocol during bolus administration of intravenous contrast.  Multiplanar reconstructed images including MIPs were obtained and reviewed to evaluate the vascular anatomy.  Contrast:  OMNIPAQUE IOHEXOL 350 MG/ML SOLN  Comparison:   None.  CTA CHEST  Findings:  There is no pleural effusion.  There is moderate centrilobular emphysema.  3 mm calcified granulomas identified in the right upper lobe.  No airspace consolidation identified.  There is no axillary or supraclavicular adenopathy identified. There is no mediastinal or hilar adenopathy.  The thoracic aorta has a normal caliber.  There is no dissection.  The heart size is normal.  No pericardial effusion.  Calcifications are noted within the LAD coronary artery.  There is mild multilevel spondylosis identified within the thoracic spine.  No worrisome bony abnormalities identified.   Review of the MIP images confirms the above findings.  IMPRESSION:  1.  No acute cardiopulmonary abnormalities. 2.  No evidence for aneurysm. 3.  Emphysema.  CTA ABDOMEN AND PELVIS  Findings:  Normal appearance of the liver.  The gallbladder appears normal.  No biliary dilatation.  Normal appearance of the pancreas. Calcified granuloma noted within the spleen.  The adrenal glands are both normal.  Normal appearance of both kidneys.  There is mild calcified atherosclerotic change involving the abdominal aorta.  No aneurysm.  There is no evidence for aortic dissection.  The stomach, visualized small and large bowel loops appear within normal limits.  No free fluid or fluid collections identified within the abdomen or pelvis.  Review of the visualized osseous structures is significant for mild lumbar spondylosis.   Review of the MIP images confirms the above findings.  IMPRESSION:  1.  No acute findings. 2.  No evidence for abdominal aortic aneurysm or dissection.   Original Report Authenticated By: Signa Kell, M.D.    Medications: I have reviewed the patient's current medications.  Assessment/Plan: 1. Large DU. Not bleeding at EGD yesterday. Would go ahead and change to oral PPI and advance diet.   Porshea Janowski JR,Marely Apgar L 07/12/2013, 10:58 AM

## 2013-07-13 LAB — CBC
MCH: 30.7 pg (ref 26.0–34.0)
MCHC: 34.8 g/dL (ref 30.0–36.0)
MCV: 88.3 fL (ref 78.0–100.0)
Platelets: 155 10*3/uL (ref 150–400)
RBC: 2.57 MIL/uL — ABNORMAL LOW (ref 4.22–5.81)

## 2013-07-13 LAB — GLUCOSE, CAPILLARY
Glucose-Capillary: 117 mg/dL — ABNORMAL HIGH (ref 70–99)
Glucose-Capillary: 101 mg/dL — ABNORMAL HIGH (ref 70–99)

## 2013-07-13 NOTE — Progress Notes (Signed)
[facility] PROGRESS NOTE  Brent Phelps UJW:[id number] DOB: August 27, 1946 DOA: 07/10/2013 PCP: [name], MD  Assessment/Plan: 1. GI Bleed - Gastroenterologist on board and making further recommendations.  - protonix currently should be administered via oral route. Order for IV protonix discontinued - will f/u on their recommendations.  2. ABLA - Will reassess cbc next am - if hgb less than 7.9 will plan on transfusing  - no active bleeding reported.  Code Status: full Family Communication: Discussed with patient at bedside.  Disposition Plan: Pending cessation of GI bleed. Possibly next am 9/15   Consultants:  GI  Procedures:  Upper endoscopy 07/11/13  Antibiotics:  none  HPI/Subjective: No acute issues reported overnight. No new complaints.  Objective: Filed Vitals:   07/13/13 1349  BP: 99/66  Pulse: 78  Temp: 98.8 F (37.1 C)  Resp: 18    Intake/Output Summary (Last 24 hours) at 07/13/13 1856 Last data filed at 07/13/13 1300  Gross per 24 hour  Intake    483 ml  Output   1565 ml  Net  -1082 ml   Filed Weights   07/10/13 2124 07/11/13 0320 07/12/13 0609  Weight: 70.308 kg (155 lb) 70.171 kg (154 lb 11.2 oz) 72.394 kg (159 lb 9.6 oz)    Exam:   General:  Pt in NAD, Alert and Awake  Cardiovascular: RRR, no MRG  Respiratory: CTA BL, no wheezes  Abdomen: soft, NT, ND  Musculoskeletal: no cyanosis or clubbing   Data Reviewed: Basic Metabolic Panel:  Recent Labs Lab 07/10/13 2137 07/11/13 0430  NA 135 137  K 3.8 3.8  CL 103 105  CO2 23 25  GLUCOSE 150* 110*  BUN 46* 40*  CREATININE 0.73 0.70  CALCIUM 9.0 8.1*   Liver Function Tests:  Recent Labs Lab 07/11/13 0430  AST 11  ALT 8  ALKPHOS 44  BILITOT 0.4  PROT 5.3*  ALBUMIN 2.9*   No results found for this basename: LIPASE, AMYLASE,  in the last 168 hours No results found for this basename: AMMONIA,  in the last 168 hours CBC:  Recent Labs Lab 07/11/13 0430  07/11/13 0910 07/11/13 1401 07/12/13 0605 07/13/13 0620  WBC 4.7 5.0 5.4 3.7* 4.2  HGB 8.1* 8.0* 7.6* 8.1* 7.9*  HCT 22.9* 22.1* 21.5* 22.3* 22.7*  MCV 88.4 88.4 88.5 88.1 88.3  PLT 169 147* 142* 257 155   Cardiac Enzymes:  Recent Labs Lab 07/11/13 0430  TROPONINI <0.30   BNP (last 3 results) No results found for this basename: PROBNP,  in the last 8760 hours CBG:  Recent Labs Lab 07/12/13 1710 07/12/13 2116 07/12/13 2356 07/13/13 1146 07/13/13 1726  GLUCAP 100* 117* 120* 107* 101*    No results found for this or any previous visit (from the past 240 hour(s)).   Studies: No results found.  Scheduled Meds: . pantoprazole  40 mg Oral BID AC  . sodium chloride  3 mL Intravenous Q12H   Continuous Infusions:    Principal Problem:   GI bleed Active Problems:   Acute blood loss anemia    Time spent: > 35 minutes    Penny Pia  [facility] Pager [phone]. If 7PM-7AM, please contact night-coverage at [email or web address], password [facility] 07/13/2013, 6:56 PM  LOS: 3 days

## 2013-07-13 NOTE — Progress Notes (Signed)
[facility] PROGRESS NOTE Subjective Pt w/o gross bleeding feels ok on PO protonix  Objective: Vital signs in last 24 hours: Temp:  [98 F (36.7 C)-98.7 F (37.1 C)] 98.7 F (37.1 C) (09/14 0501) Pulse Rate:  [77-81] 81 (09/14 0501) Resp:  [18] 18 (09/14 0501) BP: (98-102)/(59-62) 101/61 mmHg (09/14 0501) SpO2:  [95 %-98 %] 95 % (09/14 0501) Last BM Date: 07/10/13  Intake/Output from previous day: 09/13 0701 - 09/14 0700 In: 843 [P.O.:840; I.V.:3] Out: 1975 [Urine:1975] Intake/Output this shift: Total I/O In: 240 [P.O.:240] Out: 300 [Urine:300]  PE: General--NAD  Abdomen--soft and nontender  Lab Results:  Recent Labs  07/11/13 0430 07/11/13 0910 07/11/13 1401 07/12/13 0605 07/13/13 0620  WBC 4.7 5.0 5.4 3.7* 4.2  HGB 8.1* 8.0* 7.6* 8.1* 7.9*  HCT 22.9* 22.1* 21.5* 22.3* 22.7*  PLT 169 147* 142* 257 155   BMET  Recent Labs  07/10/13 2137 07/11/13 0430  NA 135 137  K 3.8 3.8  CL 103 105  CO2 23 25  CREATININE 0.73 0.70   LFT  Recent Labs  07/11/13 0430  PROT 5.3*  AST 11  ALT 8  ALKPHOS 44  BILITOT 0.4   PT/INR No results found for this basename: LABPROT, INR,  in the last 72 hours PANCREAS No results found for this basename: LIPASE,  in the last 72 hours       Studies/Results: No results found.  Medications: I have reviewed the patient's current medications.  Assessment/Plan: 1. Bleeding DU. Stable 2 days after EGD. Now on PO PPI. If Hg stable in AM, would discharge on BID PPI and have him follow-up with me in office in 2-3 weeks. He should come by next week for a cbc and I will have my office arrange that.   Meklit Cotta JR,Binta Statzer L 07/13/2013, 10:25 AM

## 2013-07-14 ENCOUNTER — Encounter (HOSPITAL_COMMUNITY): Payer: Self-pay | Admitting: Gastroenterology

## 2013-07-14 DIAGNOSIS — K269 Duodenal ulcer, unspecified as acute or chronic, without hemorrhage or perforation: Secondary | ICD-10-CM

## 2013-07-14 LAB — CBC
HCT: 25.3 % — ABNORMAL LOW (ref 39.0–52.0)
Hemoglobin: 8.7 g/dL — ABNORMAL LOW (ref 13.0–17.0)
MCHC: 34.4 g/dL (ref 30.0–36.0)
MCV: 89.7 fL (ref 78.0–100.0)
WBC: 3.7 10*3/uL — ABNORMAL LOW (ref 4.0–10.5)

## 2013-07-14 LAB — GLUCOSE, CAPILLARY: Glucose-Capillary: 103 mg/dL — ABNORMAL HIGH (ref 70–99)

## 2013-07-14 MED ORDER — PANTOPRAZOLE SODIUM 40 MG PO TBEC: 40.0000 mg | DELAYED_RELEASE_TABLET | Freq: Two times a day (BID) | ORAL | Status: DC

## 2013-07-14 NOTE — Progress Notes (Signed)
Brent Phelps 12:04 PM  Subjective: Patient without complaint and no signs of further bleeding and we discussed his ibuprofen and how he should use Tylenol only at home  Objective: Vital signs stable afebrile no acute distress abdomen is soft nontender hemoglobin stable  Assessment: Duodenal ulcer  Plan: Okay with [place] to go home and followup with my partner Dr. [name] in a few weeks and he will need H. pylori blood test at some point and I warned him about no aspirin and nonsteroidals at home and call us sooner when necessary and discharged on pump inhibitors.  Brent Phelps

## 2013-07-14 NOTE — Progress Notes (Signed)
07/14/2013 12:50 PM  Reviewed DC instructions with patient.  Went over medications, new prescription, provided contact information for Dr. [name] for f/u appointment, and provided and educated patient with information regarding a low sodium heart healthy diet, to which he verbalized understanding.  Pt had no questions.  IV lines removed.  Pt mother to pick him up shortly.   Theadora Rama

## 2013-07-14 NOTE — Discharge Summary (Signed)
Physician Discharge Summary  Brent Phelps WJX:[id number] DOB: 12-21-1945 DOA: 07/10/2013  PCP: [name], MD  Admit date: 07/10/2013 Discharge date: 07/14/2013  Time spent: > 35 minutes  Recommendations for Outpatient Follow-up:  1. Please be sure to follow up with CBC on next follow up 2. Please see GI recommendations listed below.  Discharge Diagnoses:  Principal Problem:   GI bleed Active Problems:   Acute blood loss anemia   Discharge Condition: Stable  Diet recommendation: low sodium heart healthy  Filed Weights   07/10/13 2124 07/11/13 0320 07/12/13 0609  Weight: 70.308 kg (155 lb) 70.171 kg (154 lb 11.2 oz) 72.394 kg (159 lb 9.6 oz)    History of present illness:  67 y/o found to have low hemoglobin at urgent care who presented to the ED with positive occult blood. Patient prior to presentation was taking NSAIDs for back pain.  Hospital Course:  1. GI Bleed - Gastroenterologist on board and making further recommendations.  - Gi performed an EGD (by Dr. [name]) which showed DU, GI recommended the following: Plan:  Okay with Brent Phelps to go home and followup with my partner Dr. [name] in a few weeks and he will need H. pylori blood test at some point and I warned him about no aspirin and nonsteroidals at home and call Brent Phelps sooner when necessary and discharged on pump inhibitors.    2. ABLA  - Ceased and hgb steady.  Improved on day of discharge and up from 7.9 to 8.7 - no active bleeding reported.    Procedures:  EGD by Dr. [name]  Consultations:  Dr. [name] and Dr. Ewing Schlein  Discharge Exam: Filed Vitals:   07/14/13 0900  BP: 140/80  Pulse: 81  Temp: 98.6 F (37 C)  Resp: 18    General: Pt in NAD, Alert and Awake Cardiovascular: RRR, no MRG Respiratory: CTA BL, no wheezes  Discharge Instructions  Discharge Orders   Future Orders Complete By Expires   Call MD for:  extreme fatigue  As directed    Call MD for:  persistant dizziness or  light-headedness  As directed    Call MD for:  severe uncontrolled pain  As directed    Call MD for:  temperature >100.4  As directed    Call MD for:  As directed    Diet - low sodium heart healthy  As directed    Discharge instructions  As directed    Comments:     You will need to f/u with your GI doctor (Dr. [name]) in 2 weeks or sooner should any new concerns arise.   Increase activity slowly  As directed        Medication List    STOP taking these medications       ibuprofen 200 MG tablet  Commonly known as:  ADVIL,MOTRIN      TAKE these medications       albuterol 108 (90 BASE) MCG/ACT inhaler  Commonly known as:  PROVENTIL HFA;VENTOLIN HFA  Inhale 2 puffs into the lungs every 6 (six) hours as needed for wheezing.     oxyCODONE-acetaminophen 5-325 MG per tablet  Commonly known as:  PERCOCET/ROXICET  Take 2 tablets by mouth every 4 (four) hours as needed for pain.     pantoprazole 40 MG tablet  Commonly known as:  PROTONIX  Take 1 tablet (40 mg total) by mouth 2 (two) times daily before a meal.     prednisoLONE acetate 1 % ophthalmic suspension  Commonly known  as:  PRED FORTE  Place 1 drop into the left eye 2 (two) times daily as needed (lubrication).       Allergies  Allergen Reactions  . Codeine Nausea And Vomiting      The results of significant diagnostics from this hospitalization (including imaging, microbiology, ancillary and laboratory) are listed below for reference.    Significant Diagnostic Studies: Ct Angio Abdomen W/cm &/or Wo Contrast  07/11/2013   *RADIOLOGY REPORT*  Clinical Data:  Evaluate for abdominal aortic aneurysm  CT ANGIOGRAPHY CHEST AND ABDOMEN  Technique:  Multidetector CT imaging through the chest  and abdomen was performed using the standard protocol during bolus administration of intravenous contrast.  Multiplanar reconstructed images including MIPs were obtained and reviewed to evaluate the vascular anatomy.  Contrast:  OMNIPAQUE IOHEXOL 350 MG/ML SOLN  Comparison:   None.  CTA CHEST  Findings:  There is no pleural effusion.  There is moderate centrilobular emphysema.  3 mm calcified granulomas identified in the right upper lobe.  No airspace consolidation identified.  There is no axillary or supraclavicular adenopathy identified. There is no mediastinal or hilar adenopathy.  The thoracic aorta has a normal caliber.  There is no dissection.  The heart size is normal.  No pericardial effusion.  Calcifications are noted within the LAD coronary artery.  There is mild multilevel spondylosis identified within the thoracic spine.  No worrisome bony abnormalities identified.   Review of the MIP images confirms the above findings.  IMPRESSION:  1.  No acute cardiopulmonary abnormalities. 2.  No evidence for aneurysm. 3.  Emphysema.  CTA ABDOMEN AND PELVIS  Findings:  Normal appearance of the liver.  The gallbladder appears normal.  No biliary dilatation.  Normal appearance of the pancreas. Calcified granuloma noted within the spleen.  The adrenal glands are both normal.  Normal appearance of both kidneys.  There is mild calcified atherosclerotic change involving the abdominal aorta.  No aneurysm.  There is no evidence for aortic dissection.  The stomach, visualized small and large bowel loops appear within normal limits.  No free fluid or fluid collections identified within the abdomen or pelvis.  Review of the visualized osseous structures is significant for mild lumbar spondylosis.   Review of the MIP images confirms the above findings.  IMPRESSION:  1.  No acute findings. 2.  No evidence for abdominal aortic aneurysm or dissection.   Original Report Authenticated By: Signa Kell, M.D.   Ct Angio Chest Aorta W/cm &/or Wo/cm  07/11/2013   *RADIOLOGY REPORT*  Clinical Data:  Evaluate for abdominal aortic aneurysm  CT ANGIOGRAPHY CHEST AND ABDOMEN  Technique:  Multidetector CT imaging through the chest  and abdomen was performed using  the standard protocol during bolus administration of intravenous contrast.  Multiplanar reconstructed images including MIPs were obtained and reviewed to evaluate the vascular anatomy.  Contrast: OMNIPAQUE IOHEXOL 350 MG/ML SOLN  Comparison:   None.  CTA CHEST  Findings:  There is no pleural effusion.  There is moderate centrilobular emphysema.  3 mm calcified granulomas identified in the right upper lobe.  No airspace consolidation identified.  There is no axillary or supraclavicular adenopathy identified. There is no mediastinal or hilar adenopathy.  The thoracic aorta has a normal caliber.  There is no dissection.  The heart size is normal.  No pericardial effusion.  Calcifications are noted within the LAD coronary artery.  There is mild multilevel spondylosis identified within the thoracic spine.  No worrisome bony abnormalities identified.  Review of the MIP images confirms the above findings.  IMPRESSION:  1.  No acute cardiopulmonary abnormalities. 2.  No evidence for aneurysm. 3.  Emphysema.  CTA ABDOMEN AND PELVIS  Findings:  Normal appearance of the liver.  The gallbladder appears normal.  No biliary dilatation.  Normal appearance of the pancreas. Calcified granuloma noted within the spleen.  The adrenal glands are both normal.  Normal appearance of both kidneys.  There is mild calcified atherosclerotic change involving the abdominal aorta.  No aneurysm.  There is no evidence for aortic dissection.  The stomach, visualized small and large bowel loops appear within normal limits.  No free fluid or fluid collections identified within the abdomen or pelvis.  Review of the visualized osseous structures is significant for mild lumbar spondylosis.   Review of the MIP images confirms the above findings.  IMPRESSION:  1.  No acute findings. 2.  No evidence for abdominal aortic aneurysm or dissection.   Original Report Authenticated By: Signa Kell, M.D.    Microbiology: No results found for this or  any previous visit (from the past 240 hour(s)).   Labs: Basic Metabolic Panel:  Recent Labs Lab 07/10/13 2137 07/11/13 0430  NA 135 137  K 3.8 3.8  CL 103 105  CO2 23 25  GLUCOSE 150* 110*  BUN 46* 40*  CREATININE 0.73 0.70  CALCIUM 9.0 8.1*   Liver Function Tests:  Recent Labs Lab 07/11/13 0430  AST 11  ALT 8  ALKPHOS 44  BILITOT 0.4  PROT 5.3*  ALBUMIN 2.9*   No results found for this basename: LIPASE, AMYLASE,  in the last 168 hours No results found for this basename: AMMONIA,  in the last 168 hours CBC:  Recent Labs Lab 07/11/13 0910 07/11/13 1401 07/12/13 0605 07/13/13 0620 07/14/13 0740  WBC 5.0 5.4 3.7* 4.2 3.7*  HGB 8.0* 7.6* 8.1* 7.9* 8.7*  HCT 22.1* 21.5* 22.3* 22.7* 25.3*  MCV 88.4 88.5 88.1 88.3 89.7  PLT 147* 142* 257 155 185   Cardiac Enzymes:  Recent Labs Lab 07/11/13 0430  TROPONINI <0.30   BNP: BNP (last 3 results) No results found for this basename: PROBNP,  in the last 8760 hours CBG:  Recent Labs Lab 07/12/13 2356 07/13/13 1146 07/13/13 1726 07/14/13 0014 07/14/13 0613  GLUCAP 120* 107* 101* 107* 103*       Signed:  Penny Pia  [facility] 07/14/2013, 12:28 PM

## 2016-02-22 DIAGNOSIS — H2511 Age-related nuclear cataract, right eye: Secondary | ICD-10-CM | POA: Diagnosis not present

## 2016-02-22 DIAGNOSIS — H3342 Traction detachment of retina, left eye: Secondary | ICD-10-CM | POA: Diagnosis not present

## 2016-02-22 DIAGNOSIS — H44522 Atrophy of globe, left eye: Secondary | ICD-10-CM | POA: Diagnosis not present

## 2016-02-22 DIAGNOSIS — H1712 Central corneal opacity, left eye: Secondary | ICD-10-CM | POA: Diagnosis not present

## 2016-02-25 DIAGNOSIS — E7800 Pure hypercholesterolemia, unspecified: Secondary | ICD-10-CM | POA: Diagnosis not present

## 2016-02-25 DIAGNOSIS — Z800 Family history of malignant neoplasm of digestive organs: Secondary | ICD-10-CM | POA: Diagnosis not present

## 2016-02-25 DIAGNOSIS — D692 Other nonthrombocytopenic purpura: Secondary | ICD-10-CM | POA: Diagnosis not present

## 2016-02-25 DIAGNOSIS — F17200 Nicotine dependence, unspecified, uncomplicated: Secondary | ICD-10-CM | POA: Diagnosis not present

## 2016-02-25 DIAGNOSIS — Z0000 Encounter for general adult medical examination without abnormal findings: Secondary | ICD-10-CM | POA: Diagnosis not present

## 2016-02-25 DIAGNOSIS — Z8719 Personal history of other diseases of the digestive system: Secondary | ICD-10-CM | POA: Diagnosis not present

## 2016-02-25 DIAGNOSIS — Z131 Encounter for screening for diabetes mellitus: Secondary | ICD-10-CM | POA: Diagnosis not present

## 2016-02-25 DIAGNOSIS — J449 Chronic obstructive pulmonary disease, unspecified: Secondary | ICD-10-CM | POA: Diagnosis not present

## 2016-02-25 DIAGNOSIS — Z8546 Personal history of malignant neoplasm of prostate: Secondary | ICD-10-CM | POA: Diagnosis not present

## 2016-02-25 DIAGNOSIS — Z1389 Encounter for screening for other disorder: Secondary | ICD-10-CM | POA: Diagnosis not present

## 2016-07-01 DIAGNOSIS — Z23 Encounter for immunization: Secondary | ICD-10-CM | POA: Diagnosis not present

## 2016-09-15 DIAGNOSIS — N5201 Erectile dysfunction due to arterial insufficiency: Secondary | ICD-10-CM | POA: Diagnosis not present

## 2016-09-15 DIAGNOSIS — C61 Malignant neoplasm of prostate: Secondary | ICD-10-CM | POA: Diagnosis not present

## 2016-09-15 DIAGNOSIS — E291 Testicular hypofunction: Secondary | ICD-10-CM | POA: Diagnosis not present

## 2016-10-25 DIAGNOSIS — R0602 Shortness of breath: Secondary | ICD-10-CM | POA: Diagnosis not present

## 2016-10-25 DIAGNOSIS — J449 Chronic obstructive pulmonary disease, unspecified: Secondary | ICD-10-CM | POA: Diagnosis not present

## 2016-10-25 DIAGNOSIS — J069 Acute upper respiratory infection, unspecified: Secondary | ICD-10-CM | POA: Diagnosis not present

## 2017-02-22 DIAGNOSIS — H1712 Central corneal opacity, left eye: Secondary | ICD-10-CM | POA: Diagnosis not present

## 2017-02-22 DIAGNOSIS — H2511 Age-related nuclear cataract, right eye: Secondary | ICD-10-CM | POA: Diagnosis not present

## 2017-02-22 DIAGNOSIS — H44522 Atrophy of globe, left eye: Secondary | ICD-10-CM | POA: Diagnosis not present

## 2017-02-22 DIAGNOSIS — H3342 Traction detachment of retina, left eye: Secondary | ICD-10-CM | POA: Diagnosis not present

## 2017-03-22 DIAGNOSIS — H524 Presbyopia: Secondary | ICD-10-CM | POA: Diagnosis not present

## 2017-03-22 DIAGNOSIS — H33052 Total retinal detachment, left eye: Secondary | ICD-10-CM | POA: Diagnosis not present

## 2017-03-22 DIAGNOSIS — H25811 Combined forms of age-related cataract, right eye: Secondary | ICD-10-CM | POA: Diagnosis not present

## 2017-03-22 DIAGNOSIS — H44522 Atrophy of globe, left eye: Secondary | ICD-10-CM | POA: Diagnosis not present

## 2017-04-10 DIAGNOSIS — F17200 Nicotine dependence, unspecified, uncomplicated: Secondary | ICD-10-CM | POA: Diagnosis not present

## 2017-04-10 DIAGNOSIS — Z0000 Encounter for general adult medical examination without abnormal findings: Secondary | ICD-10-CM | POA: Diagnosis not present

## 2017-04-10 DIAGNOSIS — J449 Chronic obstructive pulmonary disease, unspecified: Secondary | ICD-10-CM | POA: Diagnosis not present

## 2017-04-10 DIAGNOSIS — E7800 Pure hypercholesterolemia, unspecified: Secondary | ICD-10-CM | POA: Diagnosis not present

## 2017-05-01 DIAGNOSIS — J449 Chronic obstructive pulmonary disease, unspecified: Secondary | ICD-10-CM | POA: Diagnosis not present

## 2017-05-01 DIAGNOSIS — F17200 Nicotine dependence, unspecified, uncomplicated: Secondary | ICD-10-CM | POA: Diagnosis not present

## 2017-05-15 DIAGNOSIS — Z800 Family history of malignant neoplasm of digestive organs: Secondary | ICD-10-CM | POA: Diagnosis not present

## 2017-05-15 DIAGNOSIS — Z1211 Encounter for screening for malignant neoplasm of colon: Secondary | ICD-10-CM | POA: Diagnosis not present

## 2017-05-15 DIAGNOSIS — K640 First degree hemorrhoids: Secondary | ICD-10-CM | POA: Diagnosis not present

## 2018-02-25 DIAGNOSIS — H44522 Atrophy of globe, left eye: Secondary | ICD-10-CM | POA: Diagnosis not present

## 2018-02-25 DIAGNOSIS — H1712 Central corneal opacity, left eye: Secondary | ICD-10-CM | POA: Diagnosis not present

## 2018-02-25 DIAGNOSIS — H2511 Age-related nuclear cataract, right eye: Secondary | ICD-10-CM | POA: Diagnosis not present

## 2018-02-25 DIAGNOSIS — H3342 Traction detachment of retina, left eye: Secondary | ICD-10-CM | POA: Diagnosis not present

## 2018-05-22 DIAGNOSIS — F17200 Nicotine dependence, unspecified, uncomplicated: Secondary | ICD-10-CM | POA: Diagnosis not present

## 2018-05-22 DIAGNOSIS — Z0000 Encounter for general adult medical examination without abnormal findings: Secondary | ICD-10-CM | POA: Diagnosis not present

## 2018-05-22 DIAGNOSIS — E7800 Pure hypercholesterolemia, unspecified: Secondary | ICD-10-CM | POA: Diagnosis not present

## 2018-05-22 DIAGNOSIS — J449 Chronic obstructive pulmonary disease, unspecified: Secondary | ICD-10-CM | POA: Diagnosis not present

## 2019-03-05 DIAGNOSIS — H2511 Age-related nuclear cataract, right eye: Secondary | ICD-10-CM | POA: Diagnosis not present

## 2019-03-05 DIAGNOSIS — H44522 Atrophy of globe, left eye: Secondary | ICD-10-CM | POA: Diagnosis not present

## 2019-03-05 DIAGNOSIS — H3342 Traction detachment of retina, left eye: Secondary | ICD-10-CM | POA: Diagnosis not present

## 2019-03-05 DIAGNOSIS — H1712 Central corneal opacity, left eye: Secondary | ICD-10-CM | POA: Diagnosis not present

## 2019-08-28 DIAGNOSIS — Z0000 Encounter for general adult medical examination without abnormal findings: Secondary | ICD-10-CM | POA: Diagnosis not present

## 2019-08-28 DIAGNOSIS — F17200 Nicotine dependence, unspecified, uncomplicated: Secondary | ICD-10-CM | POA: Diagnosis not present

## 2019-08-28 DIAGNOSIS — E7800 Pure hypercholesterolemia, unspecified: Secondary | ICD-10-CM | POA: Diagnosis not present

## 2019-08-28 DIAGNOSIS — J449 Chronic obstructive pulmonary disease, unspecified: Secondary | ICD-10-CM | POA: Diagnosis not present

## 2019-08-29 DIAGNOSIS — Z8546 Personal history of malignant neoplasm of prostate: Secondary | ICD-10-CM | POA: Diagnosis not present

## 2019-08-29 DIAGNOSIS — E7800 Pure hypercholesterolemia, unspecified: Secondary | ICD-10-CM | POA: Diagnosis not present

## 2019-11-26 ENCOUNTER — Ambulatory Visit: Payer: Self-pay

## 2019-12-05 ENCOUNTER — Ambulatory Visit: Payer: Medicare Other | Attending: Internal Medicine

## 2019-12-05 DIAGNOSIS — Z23 Encounter for immunization: Secondary | ICD-10-CM | POA: Insufficient documentation

## 2019-12-05 NOTE — Progress Notes (Signed)
   Covid-19 Vaccination Clinic  Name:  Brent Phelps    MRN: [id number] DOB: Nov 20, 1945  12/05/2019  [name] was observed post Covid-19 immunization for 15 minutes without incidence. He was provided with Vaccine Information Sheet and instruction to access the V-Safe system.   [name] was instructed to call 911 with any severe reactions post vaccine: Marland Kitchen Difficulty breathing  . Swelling of your face and throat  . A fast heartbeat  . A bad rash all over your body  . Dizziness and weakness    Immunizations Administered    Name Date Dose VIS Date Route   Pfizer COVID-19 Vaccine 12/05/2019 11:40 AM 0.3 mL 10/10/2019 Intramuscular   Manufacturer: [place]   Lot: [id number]   [place]: [id number]

## 2019-12-17 ENCOUNTER — Ambulatory Visit: Payer: Self-pay

## 2019-12-30 ENCOUNTER — Ambulatory Visit: Payer: Medicare Other | Attending: Internal Medicine

## 2019-12-30 DIAGNOSIS — Z23 Encounter for immunization: Secondary | ICD-10-CM | POA: Insufficient documentation

## 2019-12-30 NOTE — Progress Notes (Signed)
   Covid-19 Vaccination Clinic  Name:  ANDIE BURGESS    MRN: [id number] DOB: [birth year]  12/30/2019  Mr. Freeney was observed post Covid-19 immunization for 15 minutes without incident. He was provided with Vaccine Information Sheet and instruction to access the V-Safe system.   Mr. Maisonet was instructed to call 911 with any severe reactions post vaccine: Marland Kitchen Difficulty breathing  . Swelling of face and throat  . A fast heartbeat  . A bad rash all over body  . Dizziness and weakness   Immunizations Administered    Name Date Dose VIS Date Route   Pfizer COVID-19 Vaccine 12/30/2019 11:47 AM 0.3 mL 10/10/2019 Intramuscular   Manufacturer: [place]   Lot: [id number]   [place]: [id number]

## 2020-03-09 ENCOUNTER — Other Ambulatory Visit: Payer: Self-pay

## 2020-03-09 ENCOUNTER — Encounter (INDEPENDENT_AMBULATORY_CARE_PROVIDER_SITE_OTHER): Payer: Self-pay | Admitting: Ophthalmology

## 2020-03-09 ENCOUNTER — Ambulatory Visit (INDEPENDENT_AMBULATORY_CARE_PROVIDER_SITE_OTHER): Payer: Medicare Other | Admitting: Ophthalmology

## 2020-03-09 DIAGNOSIS — H2511 Age-related nuclear cataract, right eye: Secondary | ICD-10-CM

## 2020-03-09 DIAGNOSIS — H43811 Vitreous degeneration, right eye: Secondary | ICD-10-CM | POA: Diagnosis not present

## 2020-03-09 DIAGNOSIS — H44522 Atrophy of globe, left eye: Secondary | ICD-10-CM

## 2020-03-09 DIAGNOSIS — Z9889 Other specified postprocedural states: Secondary | ICD-10-CM

## 2020-03-09 DIAGNOSIS — H1712 Central corneal opacity, left eye: Secondary | ICD-10-CM

## 2020-03-09 NOTE — Assessment & Plan Note (Signed)

## 2020-03-09 NOTE — Progress Notes (Signed)
03/09/2020     CHIEF COMPLAINT Patient presents for Retina Follow Up   HISTORY OF PRESENT ILLNESS: Brent Phelps is a 74 y.o. male who presents to the clinic today for:   HPI    Retina Follow Up    Patient presents with  PVD.  In right eye.  Severity is moderate.  Duration of 1 year.  Since onset it is stable.  I, the attending physician,  performed the HPI with the patient and updated documentation appropriately.          Comments    1 Year f\u OU. OCT  Pt states vision is stable. States some days vision is better than others. Denies FOL and floaters.       Last edited by Tilda Franco on 03/09/2020  8:41 AM. (History)      Referring physician: [name], MD [address],  [place] [id number]  HISTORICAL INFORMATION:   Selected notes from the MEDICAL RECORD NUMBER       CURRENT MEDICATIONS: Current Outpatient Medications (Ophthalmic Drugs)  Medication Sig  . prednisoLONE acetate (PRED FORTE) 1 % ophthalmic suspension Place 1 drop into the left eye 2 (two) times daily as needed (lubrication).    No current facility-administered medications for this visit. (Ophthalmic Drugs)   Current Outpatient Medications (Other)  Medication Sig  . albuterol (PROVENTIL HFA;VENTOLIN HFA) 108 (90 BASE) MCG/ACT inhaler Inhale 2 puffs into the lungs every 6 (six) hours as needed for wheezing.  Marland Kitchen oxyCODONE-acetaminophen (PERCOCET/ROXICET) 5-325 MG per tablet Take 2 tablets by mouth every 4 (four) hours as needed for pain.  . pantoprazole (PROTONIX) 40 MG tablet Take 1 tablet (40 mg total) by mouth 2 (two) times daily before a meal.   No current facility-administered medications for this visit. (Other)      REVIEW OF SYSTEMS:    ALLERGIES Allergies  Allergen Reactions  . Codeine Nausea And Vomiting    PAST MEDICAL HISTORY Past Medical History:  Diagnosis Date  . Elevated prostate specific antigen (PSA)   . Erectile dysfunction   .  Prostate cancer ([place])   . Shortness of breath on exertion    Past Surgical History:  Procedure Laterality Date  . ANAL FISSURE REPAIR  AGE 33  . CYSTOSCOPY  06/20/2012   Procedure: CYSTOSCOPY FLEXIBLE;  Surgeon: Ailene Rud, MD;  [facility];  Service: Urology;  Laterality: N/A;  no seeds found in bladder  . CYSTOSCOPY  06/20/2012   Procedure: CYSTOSCOPY;  Surgeon: Ailene Rud, MD;  Location: [facility];  Service: Urology;;  . ESOPHAGOGASTRODUODENOSCOPY N/A 07/11/2013   Procedure: ESOPHAGOGASTRODUODENOSCOPY (EGD);  Surgeon: Winfield Cunas., MD;  Location[facility];  Service: Endoscopy;  Laterality: N/A;  . INJECTION OF SILICONE OIL Left [id number]   Procedure: INJECTION OF SILICONE OIL;  Surgeon: Hurman Horn, MD;  Location: [place];  Service: Ophthalmology;  Laterality: Left;  . PARS PLANA VITRECTOMY Left 05/22/2013   Procedure: PARS PLANA VITRECTOMY WITH 20 GAUGE WITH  ENDO LASER AND SILICONE INJECTION LEFT EYE; Reconstruction Ruptured globe, Drainage subretinal hematoma; Evacuation of anterior chamber clot.;  Surgeon: Hurman Horn, MD;  Location: [place];  Service: Ophthalmology;  Laterality: Left;  . PHOTOCOAGULATION WITH LASER Left 05/22/2013   Procedure: PHOTOCOAGULATION WITH LASER;  Surgeon: Hurman Horn, MD;  Location: [place];  Service: Ophthalmology;  Laterality: Left;  . RADIOACTIVE SEED IMPLANT  06/20/2012   Procedure: RADIOACTIVE  SEED IMPLANT;  Surgeon: Ailene Rud, MD;  Location[facility];  Service: Urology;  Laterality: N/A;  83 seeds implanted  . RUPTURED GLOBE EXPLORATION AND REPAIR Left 05/12/2013   Procedure: REPAIR OF RUPTURED GLOBE WITH CLOSURE OF LACERATION LOWER EYELID;  Surgeon: Dara Hoyer, MD;  Location: [place];  Service: Ophthalmology;  Laterality: Left;  . THORACIC FUSION  1992     RE-DO T9 - T10 (PRIOR DISKECTOMY IN 1991)    FAMILY HISTORY Family History  Problem Relation  Age of Onset  . Cancer Brother        prostate  . Cancer Sister        breast  . Cancer Brother        colon    SOCIAL HISTORY Social History   Tobacco Use  . Smoking status: Current Every Day Smoker    Packs/day: 0.25    Years: 50.00    Pack years: 12.50    Types: Cigarettes  . Smokeless tobacco: Never Used  . Tobacco comment: PT CUT DOWN FROM 1 1/2 PPD TO 6 CIG DAILY  Substance Use Topics  . Alcohol use: No  . Drug use: No         OPHTHALMIC EXAM: Base Eye Exam    Visual Acuity (Snellen - Linear)      Right Left   Dist [place]  NLP   Dist cc 20/20        Tonometry (Tonopen, 8:41 AM)      Right Left   Pressure 13        Pupils      Pupils Dark Light Shape React APD   Right PERRL 4 3 Round Brisk None   Left PERRL            Visual Fields (Counting fingers)      Left Right     Full   Restrictions Total superior temporal, inferior temporal, superior nasal, inferior nasal deficiencies        Neuro/Psych    Oriented x3: Yes   Mood/Affect: Normal       Dilation    Right eye: 1.0% Mydriacyl, 2.5% Phenylephrine @ 8:41 AM        Slit Lamp and Fundus Exam    External Exam      Right Left   External Normal Enophthalmos       Slit Lamp Exam      Right Left   Lids/Lashes Normal Normal   Conjunctiva/Sclera White and quiet 1+ Injection   Cornea Clear Opacity: Central, Peripheral, with corneal neovascularization inferiorly.  Some irregular epithelial changes.  Opaque completely   Anterior Chamber Deep and quiet No view   Iris Round and reactive No view   Lens 2+ Nuclear sclerosis No view   Anterior Vitreous Normal  no view       Fundus Exam      Right Left   Posterior Vitreous Posterior vitreous detachment    Disc Normal No view posteriorly   C/D Ratio 0.4    Macula Normal    Vessels Normal    Periphery Normal           IMAGING AND PROCEDURES  Imaging and Procedures for 03/09/20  OCT, Retina - OD - Right Eye       Quality was good. Scan  locations included subfoveal. Central Foveal Thickness: 271. Progression has been stable. Findings include normal observations.   Notes Posterior vitreous detachment, present, no change in the foveal  region.  Very minor drusenoid change                ASSESSMENT/PLAN:  No problem-specific Assessment & Plan notes found for this encounter.      ICD-10-CM   1. Posterior vitreous detachment of right eye  H43.811 OCT, Retina - OD - Right Eye  2. Nuclear sclerotic cataract of right eye  H25.11   3. Phthisis bulbi of left eye  H44.522   4. Central opacity of cornea, left  H17.12     1.  History of complex retinal detachment left eye.,  Complicated by blunt ocular trauma with ruptured globe secondary to a golf ball injury.  2.  Corneal opacity and early enophthalmos and phthisis of the left eye is present.  Patient has no discomfort we will continue to observe.  3.  Patient informed that if the eye ever developed sudden discharge watery discharge he needs to proceed to the emergency room. 4.  Safety glasses encouraged full-time on to protect his only good diet, the right.  Ophthalmic Meds Ordered this visit:  No orders of the defined types were placed in this encounter.      No follow-ups on file.  There are no Patient Instructions on file for this visit.   Explained the diagnoses, plan, and follow up with the patient and they expressed understanding.  Patient expressed understanding of the importance of proper follow up care.   Clent Demark Lilyanne Mcquown M.D. Diseases & Surgery of the Retina and Vitreous Retina & Diabetic [place] 03/09/20     Abbreviations: M myopia (nearsighted); A astigmatism; H hyperopia (farsighted); P presbyopia; Mrx spectacle prescription;  CTL contact lenses; OD right eye; OS left eye; OU both eyes  XT exotropia; ET esotropia; PEK punctate epithelial keratitis; PEE punctate epithelial erosions; DES dry eye syndrome; MGD meibomian gland dysfunction; ATs  artificial tears; PFAT's preservative free artificial tears; [place] nuclear sclerotic cataract; PSC posterior subcapsular cataract; ERM epi-retinal membrane; PVD posterior vitreous detachment; RD retinal detachment; DM diabetes mellitus; DR diabetic retinopathy; NPDR non-proliferative diabetic retinopathy; PDR proliferative diabetic retinopathy; CSME clinically significant macular edema; DME diabetic macular edema; dbh dot blot hemorrhages; CWS cotton wool spot; POAG primary open angle glaucoma; C/D cup-to-disc ratio; HVF humphrey visual field; GVF goldmann visual field; OCT optical coherence tomography; IOP intraocular pressure; BRVO Branch retinal vein occlusion; CRVO central retinal vein occlusion; CRAO central retinal artery occlusion; BRAO branch retinal artery occlusion; RT retinal tear; SB scleral buckle; PPV pars plana vitrectomy; VH Vitreous hemorrhage; PRP panretinal laser photocoagulation; IVK intravitreal kenalog; VMT vitreomacular traction; MH Macular hole;  NVD neovascularization of the disc; NVE neovascularization elsewhere; AREDS age related eye disease study; ARMD age related macular degeneration; POAG primary open angle glaucoma; EBMD epithelial/anterior basement membrane dystrophy; ACIOL anterior chamber intraocular lens; IOL intraocular lens; PCIOL posterior chamber intraocular lens; Phaco/IOL phacoemulsification with intraocular lens placement; [place] photorefractive keratectomy; LASIK laser assisted in situ keratomileusis; HTN hypertension; DM diabetes mellitus; COPD chronic obstructive pulmonary disease

## 2020-06-24 DIAGNOSIS — Z0120 Encounter for dental examination and cleaning without abnormal findings: Secondary | ICD-10-CM | POA: Diagnosis not present

## 2020-09-09 DIAGNOSIS — E7800 Pure hypercholesterolemia, unspecified: Secondary | ICD-10-CM | POA: Diagnosis not present

## 2020-09-09 DIAGNOSIS — Z0000 Encounter for general adult medical examination without abnormal findings: Secondary | ICD-10-CM | POA: Diagnosis not present

## 2020-09-09 DIAGNOSIS — J449 Chronic obstructive pulmonary disease, unspecified: Secondary | ICD-10-CM | POA: Diagnosis not present

## 2020-09-09 DIAGNOSIS — F17200 Nicotine dependence, unspecified, uncomplicated: Secondary | ICD-10-CM | POA: Diagnosis not present

## 2021-03-08 ENCOUNTER — Other Ambulatory Visit: Payer: Self-pay

## 2021-03-08 ENCOUNTER — Encounter (INDEPENDENT_AMBULATORY_CARE_PROVIDER_SITE_OTHER): Payer: Self-pay | Admitting: Ophthalmology

## 2021-03-08 ENCOUNTER — Ambulatory Visit (INDEPENDENT_AMBULATORY_CARE_PROVIDER_SITE_OTHER): Payer: Medicare Other | Admitting: Ophthalmology

## 2021-03-08 DIAGNOSIS — H43811 Vitreous degeneration, right eye: Secondary | ICD-10-CM | POA: Diagnosis not present

## 2021-03-08 DIAGNOSIS — H2511 Age-related nuclear cataract, right eye: Secondary | ICD-10-CM | POA: Diagnosis not present

## 2021-03-08 DIAGNOSIS — H44522 Atrophy of globe, left eye: Secondary | ICD-10-CM | POA: Diagnosis not present

## 2021-03-08 NOTE — Assessment & Plan Note (Signed)
Stable OS, no medications, patient is comfortable currently

## 2021-03-08 NOTE — Assessment & Plan Note (Signed)
Minor OD no impact on acuity 

## 2021-03-08 NOTE — Patient Instructions (Signed)
Patient instructed to use protective eyewear full-time while awake so as to prevent injury to his monocular status, the right eye.

## 2021-03-08 NOTE — Progress Notes (Signed)
03/08/2021     CHIEF COMPLAINT Patient presents for Retina Follow Up (1 year fu OU OCT/Pt states, "I cannot really see any difference in my vision. Occasionally I have some soreness in my OS, but as a rule it does not bother me.")   HISTORY OF PRESENT ILLNESS: Brent Phelps is a 75 y.o. male who presents to the clinic today for:   HPI    Retina Follow Up    Diagnosis: Other   Laterality: right eye   Onset: 1 year ago   Duration: 1 year   Comments: 1 year fu OU OCT Pt states, "I cannot really see any difference in my vision. Occasionally I have some soreness in my OS, but as a rule it does not bother me."       Last edited by Kendra Opitz, COA on 03/08/2021  8:39 AM. (History)      Referring physician: [name], MD [address],  [place] [address]  HISTORICAL INFORMATION:   Selected notes from the MEDICAL RECORD NUMBER       CURRENT MEDICATIONS: Current Outpatient Medications (Ophthalmic Drugs)  Medication Sig  . prednisoLONE acetate (PRED FORTE) 1 % ophthalmic suspension Place 1 drop into the left eye 2 (two) times daily as needed (lubrication).    No current facility-administered medications for this visit. (Ophthalmic Drugs)   Current Outpatient Medications (Other)  Medication Sig  . albuterol (PROVENTIL HFA;VENTOLIN HFA) 108 (90 BASE) MCG/ACT inhaler Inhale 2 puffs into the lungs every 6 (six) hours as needed for wheezing.  Marland Kitchen oxyCODONE-acetaminophen (PERCOCET/ROXICET) 5-325 MG per tablet Take 2 tablets by mouth every 4 (four) hours as needed for pain.  . pantoprazole (PROTONIX) 40 MG tablet Take 1 tablet (40 mg total) by mouth 2 (two) times daily before a meal.   No current facility-administered medications for this visit. (Other)      REVIEW OF SYSTEMS:    ALLERGIES Allergies  Allergen Reactions  . Codeine Nausea And Vomiting    PAST MEDICAL HISTORY Past Medical History:  Diagnosis Date  . Elevated prostate  specific antigen (PSA)   . Erectile dysfunction   . Prostate cancer ([place])   . Shortness of breath on exertion    Past Surgical History:  Procedure Laterality Date  . ANAL FISSURE REPAIR  AGE 72  . CYSTOSCOPY  06/20/2012   Procedure: CYSTOSCOPY FLEXIBLE;  Surgeon: Ailene Rud, MD;  Location: [facility];  Service: Urology;  Laterality: N/A;  no seeds found in bladder  . CYSTOSCOPY  06/20/2012   Procedure: CYSTOSCOPY;  Surgeon: Ailene Rud, MD;  Location: [facility];  Service: Urology;;  . ESOPHAGOGASTRODUODENOSCOPY N/A 07/11/2013   Procedure: ESOPHAGOGASTRODUODENOSCOPY (EGD);  Surgeon: Winfield Cunas., MD;  Location: [facility] ENDOSCOPY;  Service: Endoscopy;  Laterality: N/A;  . INJECTION OF SILICONE OIL Left 7/56/4332   Procedure: INJECTION OF SILICONE OIL;  Surgeon: Hurman Horn, MD;  Location: [place];  Service: Ophthalmology;  Laterality: Left;  . PARS PLANA VITRECTOMY Left 05/22/2013   Procedure: PARS PLANA VITRECTOMY WITH 20 GAUGE WITH  ENDO LASER AND SILICONE INJECTION LEFT EYE; Reconstruction Ruptured globe, Drainage subretinal hematoma; Evacuation of anterior chamber clot.;  Surgeon: Hurman Horn, MD;  Location: [place];  Service: Ophthalmology;  Laterality: Left;  . PHOTOCOAGULATION WITH LASER Left 05/22/2013   Procedure: PHOTOCOAGULATION WITH LASER;  Surgeon: Hurman Horn, MD;  Location: [place];  Service: Ophthalmology;  Laterality: Left;  .  RADIOACTIVE SEED IMPLANT  06/20/2012   Procedure: RADIOACTIVE SEED IMPLANT;  Surgeon: Ailene Rud, MD;  Location: [facility];  Service: Urology;  Laterality: N/A;  83 seeds implanted  . RUPTURED GLOBE EXPLORATION AND REPAIR Left 05/12/2013   Procedure: REPAIR OF RUPTURED GLOBE WITH CLOSURE OF LACERATION LOWER EYELID;  Surgeon: Dara Hoyer, MD;  Location: [place];  Service: Ophthalmology;  Laterality: Left;  . THORACIC FUSION  1992     RE-DO T9 - T10 (PRIOR DISKECTOMY IN 1991)     FAMILY HISTORY Family History  Problem Relation Age of Onset  . Cancer Brother        prostate  . Cancer Sister        breast  . Cancer Brother        colon    SOCIAL HISTORY Social History   Tobacco Use  . Smoking status: Current Every Day Smoker    Packs/day: 0.25    Years: 50.00    Pack years: 12.50    Types: Cigarettes  . Smokeless tobacco: Never Used  . Tobacco comment: PT CUT DOWN FROM 1 1/2 PPD TO 6 CIG DAILY  Substance Use Topics  . Alcohol use: No  . Drug use: No         OPHTHALMIC EXAM:  Base Eye Exam    Visual Acuity (ETDRS)      Right Left   Dist [place]  NLP   Dist cc 20/20 -1    Correction: Glasses       Tonometry (Tonopen, 8:43 AM)      Right Left   Pressure 11 06       Pupils      Pupils Dark Light Shape React APD   Right PERRL 4 3 Round Brisk None   Left PERRL            Visual Fields      Left Right   Restrictions Total superior temporal, inferior temporal, superior nasal, inferior nasal deficiencies        Neuro/Psych    Oriented x3: Yes   Mood/Affect: Normal       Dilation    Right eye: 1.0% Mydriacyl, 2.5% Phenylephrine @ 8:43 AM        Slit Lamp and Fundus Exam    External Exam      Right Left   External Normal Enophthalmos       Slit Lamp Exam      Right Left   Lids/Lashes Normal Normal   Conjunctiva/Sclera White and quiet 1+ Injection   Cornea Clear Opacity: Central, Peripheral, with corneal neovascularization inferiorly.  Some irregular epithelial changes.  Opaque completely, crenated cornea with involutional change and folding from phthisis, with calcific band keratopathy forming   Anterior Chamber Deep and quiet No view   Iris Round and reactive No view   Lens 2+ Nuclear sclerosis No view   Anterior Vitreous Normal  no view       Fundus Exam      Right Left   Posterior Vitreous Posterior vitreous detachment    Disc Normal No view posteriorly   C/D Ratio 0.4    Macula Normal    Vessels Normal     Periphery Normal           IMAGING AND PROCEDURES  Imaging and Procedures for 03/08/21  OCT, Retina - OD - Right Eye       Quality was good. Scan locations included subfoveal. Central Foveal Thickness: 273.  Progression has been stable. Findings include normal observations.   Notes Posterior vitreous detachment, present, no change in the foveal region.  Very minor drusenoid change                ASSESSMENT/PLAN:  Nuclear sclerotic cataract of right eye Minor OD no impact on acuity  Phthisis bulbi of left eye Stable OS, no medications, patient is comfortable currently      ICD-10-CM   1. Posterior vitreous detachment of right eye  H43.811 OCT, Retina - OD - Right Eye  2. Nuclear sclerotic cataract of right eye  H25.11   3. Phthisis bulbi of left eye  H44.522     1.  OS, phthisis bulbi longstanding, no interval change patient comfortable  2.  No pathologic findings right eye with good acuity  3.  Ophthalmic Meds Ordered this visit:  No orders of the defined types were placed in this encounter.      Return in about 1 year (around 03/08/2022) for dilate, OD, COLOR FP.  Patient Instructions  Patient instructed to use protective eyewear full-time while awake so as to prevent injury to his monocular status, the right eye.    Explained the diagnoses, plan, and follow up with the patient and they expressed understanding.  Patient expressed understanding of the importance of proper follow up care.   Clent Demark Jonell Brumbaugh M.D. Diseases & Surgery of the Retina and Vitreous Retina & Diabetic [place] 03/08/21     Abbreviations: M myopia (nearsighted); A astigmatism; H hyperopia (farsighted); P presbyopia; Mrx spectacle prescription;  CTL contact lenses; OD right eye; OS left eye; OU both eyes  XT exotropia; ET esotropia; PEK punctate epithelial keratitis; PEE punctate epithelial erosions; DES dry eye syndrome; MGD meibomian gland dysfunction; ATs artificial tears;  PFAT's preservative free artificial tears; [place] nuclear sclerotic cataract; PSC posterior subcapsular cataract; ERM epi-retinal membrane; PVD posterior vitreous detachment; RD retinal detachment; DM diabetes mellitus; DR diabetic retinopathy; NPDR non-proliferative diabetic retinopathy; PDR proliferative diabetic retinopathy; CSME clinically significant macular edema; DME diabetic macular edema; dbh dot blot hemorrhages; CWS cotton wool spot; POAG primary open angle glaucoma; C/D cup-to-disc ratio; HVF humphrey visual field; GVF goldmann visual field; OCT optical coherence tomography; IOP intraocular pressure; BRVO Branch retinal vein occlusion; CRVO central retinal vein occlusion; CRAO central retinal artery occlusion; BRAO branch retinal artery occlusion; RT retinal tear; SB scleral buckle; PPV pars plana vitrectomy; VH Vitreous hemorrhage; PRP panretinal laser photocoagulation; IVK intravitreal kenalog; VMT vitreomacular traction; MH Macular hole;  NVD neovascularization of the disc; NVE neovascularization elsewhere; AREDS age related eye disease study; ARMD age related macular degeneration; POAG primary open angle glaucoma; EBMD epithelial/anterior basement membrane dystrophy; ACIOL anterior chamber intraocular lens; IOL intraocular lens; PCIOL posterior chamber intraocular lens; Phaco/IOL phacoemulsification with intraocular lens placement; [place] photorefractive keratectomy; LASIK laser assisted in situ keratomileusis; HTN hypertension; DM diabetes mellitus; COPD chronic obstructive pulmonary disease

## 2021-09-27 DIAGNOSIS — Z0000 Encounter for general adult medical examination without abnormal findings: Secondary | ICD-10-CM | POA: Diagnosis not present

## 2021-09-27 DIAGNOSIS — F17200 Nicotine dependence, unspecified, uncomplicated: Secondary | ICD-10-CM | POA: Diagnosis not present

## 2021-09-27 DIAGNOSIS — E7800 Pure hypercholesterolemia, unspecified: Secondary | ICD-10-CM | POA: Diagnosis not present

## 2021-09-27 DIAGNOSIS — Z8546 Personal history of malignant neoplasm of prostate: Secondary | ICD-10-CM | POA: Diagnosis not present

## 2021-09-27 DIAGNOSIS — J449 Chronic obstructive pulmonary disease, unspecified: Secondary | ICD-10-CM | POA: Diagnosis not present

## 2021-10-04 ENCOUNTER — Encounter: Payer: Self-pay | Admitting: Pulmonary Disease

## 2021-10-04 ENCOUNTER — Ambulatory Visit: Payer: Medicare Other | Admitting: Pulmonary Disease

## 2021-10-04 ENCOUNTER — Other Ambulatory Visit: Payer: Self-pay

## 2021-10-04 VITALS — BP 118/72 | HR 85 | Wt 165.0 lb

## 2021-10-04 DIAGNOSIS — J432 Centrilobular emphysema: Secondary | ICD-10-CM

## 2021-10-04 DIAGNOSIS — R0602 Shortness of breath: Secondary | ICD-10-CM | POA: Diagnosis not present

## 2021-10-04 NOTE — Addendum Note (Signed)
Addended by: Valerie Salts on: 10/04/2021 02:52 PM   Modules accepted: Orders

## 2021-10-04 NOTE — Progress Notes (Signed)
Synopsis: Referred in December 2022 for COPD by [name], MD  Subjective:   PATIENT ID: Brent Phelps GENDER: male DOB: 02-07-1946, MRN: [id number]  HPI  Chief Complaint  Patient presents with   Consult    Referred by PCP for history of COPD. Was diagnosed about 10-12 years ago. Has noticed an increase in DOE over the past 3 months.    Sade Mehlhoff is a a 75 year old male, daily smoker with history of prostate cancer who is referred to pulmonary clinic for evaluation of COPD.   Patient has been smoking since age of 57 and is currently smoking about 5 cigarettes/day.  He was previously smoking just under a pack per day until couple years ago.  He has been having progressive shortness of breath since 2018.  He was started on Symbicort inhaler in 2018 with improvement of his breathing symptoms but he has continued to have dyspnea with exertion since.  He was trialed on Trelegy Ellipta and did not notice much difference and remains back on Symbicort.  He is using as needed albuterol inhaler.  He uses the inhaler mostly with exertional activities such as yard work and working in his [employer].  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  He continues to work occasionally from his shop at home.  He does report sinus congestion.  He denies any cough or sputum production.  He does report intermittent wheezing.  He had to quit golfing last year due to shortness of breath.  He denies any issues with sleeping at night.  No nighttime awakenings with shortness of breath.  He feels rested in the morning time.  Records reviewed from Dr. Marisue Humble.   Past Medical History:  Diagnosis Date   Elevated prostate specific antigen (PSA)    Erectile dysfunction    Prostate cancer (HCC)    Shortness of breath on exertion      Family History  Problem Relation Age of Onset   Cancer Brother        prostate   Cancer Sister         breast   Cancer Brother        colon     Social History   Socioeconomic History   Marital status: Widowed    Spouse name: Not on file   Number of children: Not on file   Years of education: Not on file   Highest education level: Not on file  Occupational History   Not on file  Tobacco Use   Smoking status: Every Day    Packs/day: 0.25    Years: 50.00    Pack years: 12.50    Types: Cigarettes   Smokeless tobacco: Never   Tobacco comments:    PT CUT DOWN FROM 1 1/2 PPD TO 6 CIG DAILY  Substance and Sexual Activity   Alcohol use: No   Drug use: No   Sexual activity: Not on file  Other Topics Concern   Not on file  Social History Narrative   Not on file   Social Determinants of Health   Financial Resource Strain: Not on file  Food Insecurity: Not on file  Transportation Needs: Not on file  Physical Activity: Not on file  Stress: Not on file  Social Connections: Not on file  Intimate Partner Violence: Not on file     Allergies  Allergen Reactions   Codeine Nausea And  Vomiting     Outpatient Medications Prior to Visit  Medication Sig Dispense Refill   albuterol (PROVENTIL HFA;VENTOLIN HFA) 108 (90 BASE) MCG/ACT inhaler Inhale 2 puffs into the lungs every 6 (six) hours as needed for wheezing.     budesonide-formoterol (SYMBICORT) 160-4.5 MCG/ACT inhaler Inhale 2 puffs into the lungs 2 (two) times daily.     oxyCODONE-acetaminophen (PERCOCET/ROXICET) 5-325 MG per tablet Take 2 tablets by mouth every 4 (four) hours as needed for pain.     pantoprazole (PROTONIX) 40 MG tablet Take 1 tablet (40 mg total) by mouth 2 (two) times daily before a meal. 30 tablet 0   prednisoLONE acetate (PRED FORTE) 1 % ophthalmic suspension Place 1 drop into the left eye 2 (two) times daily as needed (lubrication).      No facility-administered medications prior to visit.    Review of Systems  Constitutional:  Negative for chills, fever, malaise/fatigue and weight loss.  HENT:   Positive for congestion. Negative for sinus pain and sore throat.   Eyes: Negative.   Respiratory:  Positive for shortness of breath and wheezing. Negative for cough, hemoptysis and sputum production.   Cardiovascular:  Negative for chest pain, palpitations, orthopnea, claudication and leg swelling.  Gastrointestinal:  Negative for abdominal pain, heartburn, nausea and vomiting.  Genitourinary: Negative.   Musculoskeletal:  Negative for joint pain and myalgias.  Skin:  Negative for rash.  Neurological:  Negative for weakness.  Endo/Heme/Allergies: Negative.   Psychiatric/Behavioral: Negative.     Objective:   Vitals:   10/04/21 1014  BP: 118/72  Pulse: 85  SpO2: 97%  Weight: 165 lb (74.8 kg)    Physical Exam Constitutional:      General: He is not in acute distress. HENT:     Head: Normocephalic and atraumatic.  Eyes:     Extraocular Movements: Extraocular movements intact.     Conjunctiva/sclera: Conjunctivae normal.     Pupils: Pupils are equal, round, and reactive to light.  Cardiovascular:     Rate and Rhythm: Normal rate and regular rhythm.     Pulses: Normal pulses.     Heart sounds: Normal heart sounds. No murmur heard. Pulmonary:     Effort: Pulmonary effort is normal.     Breath sounds: Decreased air movement present. Decreased breath sounds present. No wheezing, rhonchi or rales.  Abdominal:     General: Bowel sounds are normal.     Palpations: Abdomen is soft.  Musculoskeletal:     Right lower leg: No edema.     Left lower leg: No edema.  Lymphadenopathy:     Cervical: No cervical adenopathy.  Skin:    General: Skin is warm and dry.  Neurological:     General: No focal deficit present.     Mental Status: He is alert.  Psychiatric:        Mood and Affect: Mood normal.        Behavior: Behavior normal.        Thought Content: Thought content normal.        Judgment: Judgment normal.   CBC    Component Value Date/Time   WBC 3.7 (L) 07/14/2013 0740    RBC 2.82 (L) 07/14/2013 0740   HGB 8.7 (L) 07/14/2013 0740   HCT 25.3 (L) 07/14/2013 0740   PLT 185 07/14/2013 0740   MCV 89.7 07/14/2013 0740   MCH 30.9 07/14/2013 0740   MCHC 34.4 07/14/2013 0740   RDW 13.7 07/14/2013 0740   BMP Latest Ref  Rng & Units 07/11/2013 07/10/2013 05/21/2013  Glucose 70 - 99 mg/dL 110(H) 150(H) 139(H)  BUN 6 - 23 mg/dL 40(H) 46(H) 16  Creatinine 0.50 - 1.35 mg/dL 0.70 0.73 0.81  Sodium 135 - 145 mEq/L 137 135 139  Potassium 3.5 - 5.1 mEq/L 3.8 3.8 3.7  Chloride 96 - 112 mEq/L 105 103 100  CO2 19 - 32 mEq/L 25 23 30   Calcium 8.4 - 10.5 mg/dL 8.1(L) 9.0 9.7    Chest imaging: CT Chest 05/12/13 There is no pleural effusion.  There is moderate  centrilobular emphysema.  3 mm calcified granulomas identified in  the right upper lobe.  No airspace consolidation identified.  PFT: No flowsheet data found.  Labs:  Path:  Echo:  Heart Catheterization:  Assessment & Plan:   Centrilobular emphysema ([place]) - Plan: Pulmonary Function Test, Pulse oximetry, overnight  Discussion: Wadsworth Skolnick is a a 75 year old male, daily smoker with history of prostate cancer who is referred to pulmonary clinic for evaluation of COPD.   Patient has history of centrilobular emphysema as noted on CT chest scan from 2014.  We will obtain pulmonary function testing to further evaluate his lung function.  He continues to have progressive dyspnea over recent years.  I advised him to quit smoking altogether as this is likely contributing to the progressive decline in his breathing status.  He is to continue Symbicort 2 puffs twice daily and as needed albuterol.  We will order him a nebulizer machine and albuterol solution to use as needed.  We will check overnight oximetry.  We will check a high-resolution CT chest scan.  Follow-up in 2 months with pulmonary function testing.  Freda Jackson, MD [facility] Office: [phone]   Current Outpatient  Medications:    albuterol (PROVENTIL HFA;VENTOLIN HFA) 108 (90 BASE) MCG/ACT inhaler, Inhale 2 puffs into the lungs every 6 (six) hours as needed for wheezing., Disp: , Rfl:    budesonide-formoterol (SYMBICORT) 160-4.5 MCG/ACT inhaler, Inhale 2 puffs into the lungs 2 (two) times daily., Disp: , Rfl:

## 2021-10-04 NOTE — Patient Instructions (Signed)
Continue symbicort 2 puffs twice daily  Continue as needed albuterol inhaler or nebulizer treatment every 4-6 hours  We will send in order for a nebulizer machine and supplies  We will check an overnight oximitry test  We will check a CT Chest scan  We will have you follow up in 2 months with pulmonary function tests

## 2021-10-05 ENCOUNTER — Telehealth: Payer: Self-pay | Admitting: Pulmonary Disease

## 2021-10-06 DIAGNOSIS — J432 Centrilobular emphysema: Secondary | ICD-10-CM | POA: Diagnosis not present

## 2021-10-06 MED ORDER — ALBUTEROL SULFATE (2.5 MG/3ML) 0.083% IN NEBU: 2.5000 mg | INHALATION_SOLUTION | Freq: Four times a day (QID) | RESPIRATORY_TRACT | 12 refills | Status: DC | PRN

## 2021-10-06 NOTE — Telephone Encounter (Signed)
I called the patient and he is aware that the nebulizer solution was sent to [facility] mail order pharmacy. He did not have any questions.

## 2021-10-11 ENCOUNTER — Telehealth: Payer: Self-pay | Admitting: Pulmonary Disease

## 2021-10-11 DIAGNOSIS — J432 Centrilobular emphysema: Secondary | ICD-10-CM | POA: Diagnosis not present

## 2021-10-11 NOTE — Telephone Encounter (Signed)
Spoke with [employer] via Teams. She faxed the CMN back this morning. Will call [facility] in the morning.

## 2021-10-12 DIAGNOSIS — J449 Chronic obstructive pulmonary disease, unspecified: Secondary | ICD-10-CM | POA: Diagnosis not present

## 2021-10-12 NOTE — Telephone Encounter (Signed)
Spoke with [employer]  Advised that we have faxed them back to signed CMN  She confirmed that she received this  Nothing further needed

## 2021-10-21 ENCOUNTER — Ambulatory Visit (INDEPENDENT_AMBULATORY_CARE_PROVIDER_SITE_OTHER)
Admission: RE | Admit: 2021-10-21 | Discharge: 2021-10-21 | Disposition: A | Discharge status: Home / Self Care | Payer: Medicare Other | Source: Ambulatory Visit | Attending: Pulmonary Disease | Admitting: Pulmonary Disease

## 2021-10-21 ENCOUNTER — Other Ambulatory Visit: Payer: Self-pay

## 2021-10-21 DIAGNOSIS — J432 Centrilobular emphysema: Secondary | ICD-10-CM

## 2021-10-21 DIAGNOSIS — J929 Pleural plaque without asbestos: Secondary | ICD-10-CM | POA: Diagnosis not present

## 2021-10-21 DIAGNOSIS — R0602 Shortness of breath: Secondary | ICD-10-CM | POA: Diagnosis not present

## 2021-10-21 DIAGNOSIS — I700 Atherosclerosis of aorta: Secondary | ICD-10-CM | POA: Diagnosis not present

## 2021-10-21 DIAGNOSIS — I2510 Atherosclerotic heart disease of native coronary artery without angina pectoris: Secondary | ICD-10-CM | POA: Diagnosis not present

## 2021-11-03 ENCOUNTER — Other Ambulatory Visit: Payer: Self-pay

## 2021-11-03 DIAGNOSIS — J432 Centrilobular emphysema: Secondary | ICD-10-CM

## 2021-11-03 DIAGNOSIS — R911 Solitary pulmonary nodule: Secondary | ICD-10-CM

## 2021-11-29 ENCOUNTER — Other Ambulatory Visit: Payer: Self-pay

## 2021-11-29 ENCOUNTER — Ambulatory Visit: Payer: Medicare Other | Admitting: Pulmonary Disease

## 2021-11-29 ENCOUNTER — Encounter: Payer: Self-pay | Admitting: Pulmonary Disease

## 2021-11-29 VITALS — BP 122/82 | HR 72 | Ht 66.5 in | Wt 167.4 lb

## 2021-11-29 DIAGNOSIS — R911 Solitary pulmonary nodule: Secondary | ICD-10-CM | POA: Diagnosis not present

## 2021-11-29 NOTE — Progress Notes (Signed)
Synopsis: Referred in December 2022 for COPD by [name], MD  Subjective:   PATIENT ID: Brent Phelps GENDER: male DOB: 08-16-1946, MRN: [id number]  HPI  Chief Complaint  Patient presents with   Follow-up    8 wk f/u. States he has been doing well since last visit. Still using the Symbicort daily.    Brent Phelps is a a 76 year old male, daily smoker with history of prostate cancer who returns to pulmonary clinic for follow up of COPD.   HRCT Chest 10/21/21 shows moderate centrilobular and paraseptal emphysema with diffuse bronchial wall thickening. There is a 1.1 x 0.9cm thick walled cavitary lesion of the righ apex.   He continues on symbicort 160-4.57mcg 2 puffs twice daily. Using the nebulizer as needed with some relief along with albuterol inhaler.   He is schedule for PFTs next month. He had overnight oximitry done last month. He continues to smoke intermittently.  OV 10/04/21 Patient has been smoking since age of 41 and is currently smoking about 5 cigarettes/day.  He was previously smoking just under a pack per day until couple years ago.  He has been having progressive shortness of breath since 2018.  He was started on Symbicort inhaler in 2018 with improvement of his breathing symptoms but he has continued to have dyspnea with exertion since.  He was trialed on Trelegy Ellipta and did not notice much difference and remains back on Symbicort.  He is using as needed albuterol inhaler.  He uses the inhaler mostly with exertional activities such as yard work and working in his [employer].  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  He continues to work occasionally from his shop at home.  He does report sinus congestion.  He denies any cough or sputum production.  He does report intermittent wheezing.  He had to quit golfing last year due to shortness of breath.  He denies any issues with sleeping  at night.  No nighttime awakenings with shortness of breath.  He feels rested in the morning time.  Records reviewed from Dr. Marisue Humble.   Past Medical History:  Diagnosis Date   Elevated prostate specific antigen (PSA)    Erectile dysfunction    Prostate cancer (HCC)    Shortness of breath on exertion      Family History  Problem Relation Age of Onset   Cancer Brother        prostate   Cancer Sister        breast   Cancer Brother        colon     Social History   Socioeconomic History   Marital status: Widowed    Spouse name: Not on file   Number of children: Not on file   Years of education: Not on file   Highest education level: Not on file  Occupational History   Not on file  Tobacco Use   Smoking status: Every Day    Packs/day: 0.25    Years: 50.00    Pack years: 12.50    Types: Cigarettes   Smokeless tobacco: Never   Tobacco comments:    PT CUT DOWN FROM 1 1/2 PPD TO 6 CIG DAILY  Substance and Sexual Activity   Alcohol use: No   Drug use: No   Sexual activity: Not on file  Other Topics Concern   Not on file  Social History Narrative  Not on file   Social Determinants of Health   Financial Resource Strain: Not on file  Food Insecurity: Not on file  Transportation Needs: Not on file  Physical Activity: Not on file  Stress: Not on file  Social Connections: Not on file  Intimate Partner Violence: Not on file     Allergies  Allergen Reactions   Codeine Nausea And Vomiting     Outpatient Medications Prior to Visit  Medication Sig Dispense Refill   albuterol (PROVENTIL HFA;VENTOLIN HFA) 108 (90 BASE) MCG/ACT inhaler Inhale 2 puffs into the lungs every 6 (six) hours as needed for wheezing.     albuterol (PROVENTIL) (2.5 MG/3ML) 0.083% nebulizer solution Take 3 mLs (2.5 mg total) by nebulization every 6 (six) hours as needed for wheezing or shortness of breath. 120 mL 12   budesonide-formoterol (SYMBICORT) 160-4.5 MCG/ACT inhaler Inhale 2 puffs into  the lungs 2 (two) times daily.     No facility-administered medications prior to visit.    Review of Systems  Constitutional:  Negative for chills, fever, malaise/fatigue and weight loss.  HENT:  Negative for congestion, sinus pain and sore throat.   Eyes: Negative.   Respiratory:  Positive for shortness of breath. Negative for cough, hemoptysis, sputum production and wheezing.   Cardiovascular:  Negative for chest pain, palpitations, orthopnea, claudication and leg swelling.  Gastrointestinal:  Negative for abdominal pain, heartburn, nausea and vomiting.  Genitourinary: Negative.   Musculoskeletal:  Negative for joint pain and myalgias.  Skin:  Negative for rash.  Neurological:  Negative for weakness.  Endo/Heme/Allergies: Negative.   Psychiatric/Behavioral: Negative.     Objective:   Vitals:   11/29/21 1000  BP: 122/82  Pulse: 72  SpO2: 96%  Weight: 167 lb 6.4 oz (75.9 kg)  Height: 5' 6.5" (1.689 m)     Physical Exam Constitutional:      General: He is not in acute distress. HENT:     Head: Normocephalic and atraumatic.  Eyes:     Conjunctiva/sclera: Conjunctivae normal.  Cardiovascular:     Rate and Rhythm: Normal rate and regular rhythm.     Pulses: Normal pulses.     Heart sounds: Normal heart sounds. No murmur heard. Pulmonary:     Effort: Pulmonary effort is normal.     Breath sounds: Decreased air movement present. Decreased breath sounds present. No wheezing, rhonchi or rales.  Musculoskeletal:     Right lower leg: No edema.     Left lower leg: No edema.  Skin:    General: Skin is warm and dry.  Neurological:     General: No focal deficit present.     Mental Status: He is alert.  Psychiatric:        Mood and Affect: Mood normal.        Behavior: Behavior normal.        Thought Content: Thought content normal.        Judgment: Judgment normal.   CBC    Component Value Date/Time   WBC 3.7 (L) 07/14/2013 0740   RBC 2.82 (L) 07/14/2013 0740   HGB  8.7 (L) 07/14/2013 0740   HCT 25.3 (L) 07/14/2013 0740   PLT 185 07/14/2013 0740   MCV 89.7 07/14/2013 0740   MCH 30.9 07/14/2013 0740   MCHC 34.4 07/14/2013 0740   RDW 13.7 07/14/2013 0740   BMP Latest Ref Rng & Units 07/11/2013 07/10/2013 05/21/2013  Glucose 70 - 99 mg/dL 110(H) 150(H) 139(H)  BUN 6 - 23 mg/dL 40(H)  46(H) 16  Creatinine 0.50 - 1.35 mg/dL 0.70 0.73 0.81  Sodium 135 - 145 mEq/L 137 135 139  Potassium 3.5 - 5.1 mEq/L 3.8 3.8 3.7  Chloride 96 - 112 mEq/L 105 103 100  CO2 19 - 32 mEq/L 25 23 30   Calcium 8.4 - 10.5 mg/dL 8.1(L) 9.0 9.7    Chest imaging: HRCT Chest 10/21/21 1. No evidence of fibrotic interstitial lung disease. 2. Emphysema and diffuse bilateral bronchial wall thickening. 3. There is a small thick-walled cavitary lesion of the right pulmonary apex measuring 1.1 x 0.9 cm. This is nonspecific and may be infectious or inflammatory although suspicious for malignancy. Consider one of the following in 3 months for both low-risk and high-risk individuals: (a) repeat chest CT, (b) follow-up PET-CT, or (c) tissue sampling.   CT Chest 05/12/13 There is no pleural effusion.  There is moderate  centrilobular emphysema.  3 mm calcified granulomas identified in  the right upper lobe.  No airspace consolidation identified.  PFT: No flowsheet data found.  Labs:  Path:  Echo:  Heart Catheterization:  Assessment & Plan:   Pulmonary nodule 1 cm or greater in diameter  Discussion: Brent Phelps is a a 76 year old male, daily smoker with history of prostate cancer who returns to pulmonary clinic for follow up of COPD.   Patient has history of centrilobular emphysema as noted on CT chest scan from 2014.  He is scheduled for pulmonary function tests next month and follow up CT Chest scan on 01/06/22. He is to continue Symbicort 2 puffs twice daily and as needed albuterol inhaler or nebulizer treatment.  I advised him to quit smoking altogether as this is likely  contributing to the progressive decline in his breathing status.   We will request overnight oximetry report.  Follow-up in March after repeat CT chest scan  Freda Jackson, MD [facility] Office: [phone]   Current Outpatient Medications:    albuterol (PROVENTIL HFA;VENTOLIN HFA) 108 (90 BASE) MCG/ACT inhaler, Inhale 2 puffs into the lungs every 6 (six) hours as needed for wheezing., Disp: , Rfl:    albuterol (PROVENTIL) (2.5 MG/3ML) 0.083% nebulizer solution, Take 3 mLs (2.5 mg total) by nebulization every 6 (six) hours as needed for wheezing or shortness of breath., Disp: 120 mL, Rfl: 12   budesonide-formoterol (SYMBICORT) 160-4.5 MCG/ACT inhaler, Inhale 2 puffs into the lungs 2 (two) times daily., Disp: , Rfl:

## 2021-11-29 NOTE — Patient Instructions (Addendum)
We will request your overnight oximetry test results  You are scheduled for pulmonary function tests on 12/13/21.  We will schedule you for a CT Chest in March for follow up of the right upper lobe lung nodule.   Continue symbicort twice daily and as needed nebulizer or albuterol inhaler

## 2021-12-12 ENCOUNTER — Other Ambulatory Visit: Payer: Self-pay

## 2021-12-12 ENCOUNTER — Ambulatory Visit: Payer: Medicare Other | Admitting: Pulmonary Disease

## 2021-12-12 ENCOUNTER — Ambulatory Visit (INDEPENDENT_AMBULATORY_CARE_PROVIDER_SITE_OTHER): Payer: Medicare Other | Admitting: Pulmonary Disease

## 2021-12-12 DIAGNOSIS — J432 Centrilobular emphysema: Secondary | ICD-10-CM | POA: Diagnosis not present

## 2021-12-12 LAB — PULMONARY FUNCTION TEST
DL/VA % pred: 82 %
DL/VA: 3.31 ml/min/mmHg/L
DLCO cor % pred: 70 %
DLCO cor: 15.53 ml/min/mmHg
DLCO unc % pred: 70 %
DLCO unc: 15.53 ml/min/mmHg
FEF 25-75 Post: 0.54 L/sec
FEF 25-75 Pre: 0.35 L/sec
FEF2575-%Change-Post: 54 %
FEF2575-%Pred-Post: 30 %
FEF2575-%Pred-Pre: 19 %
FEV1-%Change-Post: 26 %
FEV1-%Pred-Post: 39 %
FEV1-%Pred-Pre: 31 %
FEV1-Post: 0.98 L
FEV1-Pre: 0.77 L
FEV1FVC-%Change-Post: 8 %
FEV1FVC-%Pred-Pre: 55 %
FEV6-%Change-Post: 19 %
FEV6-%Pred-Post: 69 %
FEV6-%Pred-Pre: 58 %
FEV6-Post: 2.27 L
FEV6-Pre: 1.89 L
FEV6FVC-%Change-Post: 2 %
FEV6FVC-%Pred-Post: 107 %
FEV6FVC-%Pred-Pre: 104 %
FVC-%Change-Post: 16 %
FVC-%Pred-Post: 64 %
FVC-%Pred-Pre: 55 %
FVC-Post: 2.27 L
FVC-Pre: 1.94 L
Post FEV1/FVC ratio: 43 %
Post FEV6/FVC ratio: 100 %
Pre FEV1/FVC ratio: 40 %
Pre FEV6/FVC Ratio: 98 %

## 2021-12-12 NOTE — Progress Notes (Signed)
Full PFT performed today. °

## 2021-12-12 NOTE — Patient Instructions (Signed)
Full PFT performed today. °

## 2021-12-19 DIAGNOSIS — J449 Chronic obstructive pulmonary disease, unspecified: Secondary | ICD-10-CM | POA: Diagnosis not present

## 2022-01-06 ENCOUNTER — Other Ambulatory Visit: Payer: Self-pay

## 2022-01-06 ENCOUNTER — Ambulatory Visit
Admission: RE | Admit: 2022-01-06 | Discharge: 2022-01-06 | Disposition: A | Discharge status: Home / Self Care | Payer: Medicare Other | Source: Ambulatory Visit | Attending: Pulmonary Disease | Admitting: Pulmonary Disease

## 2022-01-06 DIAGNOSIS — R0602 Shortness of breath: Secondary | ICD-10-CM | POA: Diagnosis not present

## 2022-01-06 DIAGNOSIS — R911 Solitary pulmonary nodule: Secondary | ICD-10-CM

## 2022-01-06 DIAGNOSIS — J432 Centrilobular emphysema: Secondary | ICD-10-CM

## 2022-01-06 DIAGNOSIS — I700 Atherosclerosis of aorta: Secondary | ICD-10-CM | POA: Diagnosis not present

## 2022-01-06 DIAGNOSIS — J439 Emphysema, unspecified: Secondary | ICD-10-CM | POA: Diagnosis not present

## 2022-01-10 ENCOUNTER — Encounter: Payer: Self-pay | Admitting: Pulmonary Disease

## 2022-01-10 ENCOUNTER — Ambulatory Visit: Payer: Medicare Other | Admitting: Pulmonary Disease

## 2022-01-10 ENCOUNTER — Other Ambulatory Visit: Payer: Self-pay

## 2022-01-10 VITALS — BP 122/74 | HR 87 | Ht 66.5 in | Wt 167.0 lb

## 2022-01-10 DIAGNOSIS — J432 Centrilobular emphysema: Secondary | ICD-10-CM

## 2022-01-10 DIAGNOSIS — J449 Chronic obstructive pulmonary disease, unspecified: Secondary | ICD-10-CM | POA: Diagnosis not present

## 2022-01-10 DIAGNOSIS — R911 Solitary pulmonary nodule: Secondary | ICD-10-CM

## 2022-01-10 NOTE — Patient Instructions (Addendum)
We will contact Dr. Valeta Harms and Dr. Lamonte Sakai to get you scheduled for the navigational bronchoscopy for biopsy of the right upper lung nodule.  ? ?Continue symbicort 2 puffs twice daily ? ?Continue as needed albuterol.  ? ?Follow up in 3 months ? ? ?

## 2022-01-10 NOTE — H&P (View-Only) (Signed)
? ?Synopsis: Referred in December 2022 for COPD by [name], MD ? ?Subjective:  ? ?PATIENT ID: Brent Phelps GENDER: male DOB: [birth year], MRN: [id number] ? ?HPI ? ?Chief Complaint  ?Patient presents with  ? Follow-up  ?  F/U after PFT and CT scan. Stopped smoking about 3 weeks ago.   ? ?Brent Phelps is a a 76 year old male, daily smoker with history of prostate cancer who returns to pulmonary clinic for follow up of COPD.  ? ?He reports no changes in his breathing since last visit.  He continues to have exertional dyspnea.  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  He is using Symbicort 2 puffs twice daily and as needed albuterol inhaler and nebulizer treatments. ? ?We reviewed CT chest scan in clinic today where the right upper lobe nodule/cavitary lesion remains stable over the past 3 months.  Gust moving forward with a CT PET scan versus navigational bronchoscopy.  He wishes to proceed with biopsy at this time. ? ?He quit smoking cigarettes 3 weeks ago. ? ?OV 11/29/21 ?HRCT Chest 10/21/21 shows moderate centrilobular and paraseptal emphysema with diffuse bronchial wall thickening. There is a 1.1 x 0.9cm thick walled cavitary lesion of the right apex.  ? ?He continues on symbicort 160-4.49mg 2 puffs twice daily. Using the nebulizer as needed with some relief along with albuterol inhaler.  ? ?He is scheduled for PFTs next month. He had overnight oximitry done last month. He continues to smoke intermittently. ? ?OV 10/04/21 ?Patient has been smoking since age of 89and is currently smoking about 5 cigarettes/day.  He was previously smoking just under a pack per day until couple years ago.  He has been having progressive shortness of breath since 2018.  He was started on Symbicort inhaler in 2018 with improvement of his breathing symptoms but he has continued to have dyspnea with exertion since.  He was trialed on Trelegy Ellipta and did not  notice much difference and remains back on Symbicort.  He is using as needed albuterol inhaler.  He uses the inhaler mostly with exertional activities such as yard work and working in his [employer]  He has been working in [employer]and has significant history to wood dust, paint fumes and priming fumes.  He continues to work occasionally from his shop at home. ? ?He does report sinus congestion.  He denies any cough or sputum production.  He does report intermittent wheezing.  He had to quit golfing last year due to shortness of breath.  He denies any issues with sleeping at night.  No nighttime awakenings with shortness of breath.  He feels rested in the morning time. ? ?Records reviewed from Dr. EMarisue Humble  ? ?Past Medical History:  ?Diagnosis Date  ? Elevated prostate specific antigen (PSA)   ? Erectile dysfunction   ? [place]   ? Shortness of breath on exertion   ?  ? ?Family History  ?Problem Relation Age of Onset  ? Cancer Brother   ?     prostate  ? Cancer Sister   ?     breast  ? Cancer Brother   ?     colon  ?  ? ?Social History  ? ?Socioeconomic History  ? Marital status: Widowed  ?  Spouse name: Not on file  ? Number of children: Not on file  ? Years of education: Not on file  ?  Highest education level: Not on file  ?Occupational History  ? Not on file  ?Tobacco Use  ? Smoking status: Former  ?  Packs/day: 0.25  ?  Years: 50.00  ?  Pack years: 12.50  ?  Types: Cigarettes  ?  Quit date: 12/13/2021  ?  Years since quitting: 0.0  ? Smokeless tobacco: Never  ? Tobacco comments:  ?  PT CUT DOWN FROM 1 1/2 PPD TO 6 CIG DAILY  ?Substance and Sexual Activity  ? Alcohol use: No  ? Drug use: No  ? Sexual activity: Not on file  ?Other Topics Concern  ? Not on file  ?Social History Narrative  ? Not on file  ? ?Social Determinants of Health  ? ?Financial Resource Strain: Not on file  ?Food Insecurity: Not on file  ?Transportation Needs: Not on file  ?Physical Activity: Not on file   ?Stress: Not on file  ?Social Connections: Not on file  ?Intimate Partner Violence: Not on file  ?  ? ?Allergies  ?Allergen Reactions  ? Codeine Nausea And Vomiting  ?  ? ?Outpatient Medications Prior to Visit  ?Medication Sig Dispense Refill  ? albuterol (PROVENTIL HFA;VENTOLIN HFA) 108 (90 BASE) MCG/ACT inhaler Inhale 2 puffs into the lungs every 6 (six) hours as needed for wheezing.    ? albuterol (PROVENTIL) (2.5 MG/3ML) 0.083% nebulizer solution Take 3 mLs (2.5 mg total) by nebulization every 6 (six) hours as needed for wheezing or shortness of breath. 120 mL 12  ? budesonide-formoterol (SYMBICORT) 160-4.5 MCG/ACT inhaler Inhale 2 puffs into the lungs 2 (two) times daily.    ? ?No facility-administered medications prior to visit.  ? ?Review of Systems  ?Constitutional:  Negative for chills, fever, malaise/fatigue and weight loss.  ?HENT:  Negative for congestion, sinus pain and sore throat.   ?Eyes: Negative.   ?Respiratory:  Positive for shortness of breath. Negative for cough, hemoptysis, sputum production and wheezing.   ?Cardiovascular:  Negative for chest pain, palpitations, orthopnea, claudication and leg swelling.  ?Gastrointestinal:  Negative for abdominal pain, heartburn, nausea and vomiting.  ?Genitourinary: Negative.   ?Musculoskeletal:  Negative for joint pain and myalgias.  ?Skin:  Negative for rash.  ?Neurological:  Negative for weakness.  ?Endo/Heme/Allergies: Negative.   ?Psychiatric/Behavioral: Negative.    ? ?Objective:  ? ?Vitals:  ? 01/10/22 0930  ?BP: 122/74  ?Pulse: 87  ?SpO2: 96%  ?Weight: 167 lb (75.8 kg)  ?Height: 5' 6.5" (1.689 m)  ? ?Physical Exam ?Constitutional:   ?   General: He is not in acute distress. ?HENT:  ?   Head: Normocephalic and atraumatic.  ?Eyes:  ?   Conjunctiva/sclera: Conjunctivae normal.  ?Cardiovascular:  ?   Rate and Rhythm: Normal rate and regular rhythm.  ?   Pulses: Normal pulses.  ?   Heart sounds: Normal heart sounds. No murmur heard. ?Pulmonary:  ?    Effort: Pulmonary effort is normal.  ?   Breath sounds: Decreased air movement present. Decreased breath sounds present. No wheezing, rhonchi or rales.  ?Musculoskeletal:  ?   Right lower leg: No edema.  ?   Left lower leg: No edema.  ?Skin: ?   General: Skin is warm and dry.  ?Neurological:  ?   General: No focal deficit present.  ?   Mental Status: He is alert.  ?Psychiatric:     ?   Mood and Affect: Mood normal.     ?   Behavior: Behavior normal.     ?  Thought Content: Thought content normal.     ?   Judgment: Judgment normal.  ? ?CBC ?   ?Component Value Date/Time  ? WBC 3.7 (L) 07/14/2013 0740  ? RBC 2.82 (L) 07/14/2013 0740  ? HGB 8.7 (L) 07/14/2013 0740  ? HCT 25.3 (L) 07/14/2013 0740  ? PLT 185 07/14/2013 0740  ? MCV 89.7 07/14/2013 0740  ? MCH 30.9 07/14/2013 0740  ? MCHC 34.4 07/14/2013 0740  ? RDW 13.7 07/14/2013 0740  ? ?BMP Latest Ref Rng & Units 07/11/2013 07/10/2013 05/21/2013  ?Glucose 70 - 99 mg/dL 110(H) 150(H) 139(H)  ?BUN 6 - 23 mg/dL 40(H) 46(H) 16  ?Creatinine 0.50 - 1.35 mg/dL 0.70 0.73 0.81  ?Sodium 135 - 145 mEq/L 137 135 139  ?Potassium 3.5 - 5.1 mEq/L 3.8 3.8 3.7  ?Chloride 96 - 112 mEq/L 105 103 100  ?CO2 19 - 32 mEq/L '25 23 30  '$ ?Calcium 8.4 - 10.5 mg/dL 8.1(L) 9.0 9.7  ? ?Chest imaging: ?CT Chest 01/06/22 ?1. Persistent finding of a spiculated irregular nodule in the right ?upper lung measuring up to 1.2 cm diameter. Cavitation is present ?but less prominent than on the prior study. Consider PET-CT versus ?tissue sampling for further evaluation. ?2. Emphysematous changes in the lungs. ?3. Aortic atherosclerosis. ? ?HRCT Chest 10/21/21 ?1. No evidence of fibrotic interstitial lung disease. ?2. Emphysema and diffuse bilateral bronchial wall thickening. ?3. There is a small thick-walled cavitary lesion of the right ?pulmonary apex measuring 1.1 x 0.9 cm. This is nonspecific and may ?be infectious or inflammatory although suspicious for malignancy. ?Consider one of the following in 3 months  for both low-risk and ?high-risk individuals: (a) repeat chest CT, (b) follow-up PET-CT, or ?(c) tissue sampling.  ? ?CT Chest 05/12/13 ?There is no pleural effusion.  There is moderate  ?centrilobular emphysema.  3 mm

## 2022-01-10 NOTE — Progress Notes (Signed)
? ?Synopsis: Referred in December 2022 for COPD by [name], MD ? ?Subjective:  ? ?PATIENT ID: Brent Phelps GENDER: male DOB: May 10, 1946, MRN: [id number] ? ?HPI ? ?Chief Complaint  ?Patient presents with  ? Follow-up  ?  F/U after PFT and CT scan. Stopped smoking about 3 weeks ago.   ? ?Brent Phelps is a a 76 year old male, daily smoker with history of prostate cancer who returns to pulmonary clinic for follow up of COPD.  ? ?He reports no changes in his breathing since last visit.  He continues to have exertional dyspnea.  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  He is using Symbicort 2 puffs twice daily and as needed albuterol inhaler and nebulizer treatments. ? ?We reviewed CT chest scan in clinic today where the right upper lobe nodule/cavitary lesion remains stable over the past 3 months.  Gust moving forward with a CT PET scan versus navigational bronchoscopy.  He wishes to proceed with biopsy at this time. ? ?He quit smoking cigarettes 3 weeks ago. ? ?OV 11/29/21 ?HRCT Chest 10/21/21 shows moderate centrilobular and paraseptal emphysema with diffuse bronchial wall thickening. There is a 1.1 x 0.9cm thick walled cavitary lesion of the right apex.  ? ?He continues on symbicort 160-4.56mg 2 puffs twice daily. Using the nebulizer as needed with some relief along with albuterol inhaler.  ? ?He is scheduled for PFTs next month. He had overnight oximitry done last month. He continues to smoke intermittently. ? ?OV 10/04/21 ?Patient has been smoking since age of 89and is currently smoking about 5 cigarettes/day.  He was previously smoking just under a pack per day until couple years ago.  He has been having progressive shortness of breath since 2018.  He was started on Symbicort inhaler in 2018 with improvement of his breathing symptoms but he has continued to have dyspnea with exertion since.  He was trialed on Trelegy Ellipta and did not  notice much difference and remains back on Symbicort.  He is using as needed albuterol inhaler.  He uses the inhaler mostly with exertional activities such as yard work and working in his [employer]  He has been working in [employer]and has significant history to wood dust, paint fumes and priming fumes.  He continues to work occasionally from his shop at home. ? ?He does report sinus congestion.  He denies any cough or sputum production.  He does report intermittent wheezing.  He had to quit golfing last year due to shortness of breath.  He denies any issues with sleeping at night.  No nighttime awakenings with shortness of breath.  He feels rested in the morning time. ? ?Records reviewed from Dr. EMarisue Humble  ? ?Past Medical History:  ?Diagnosis Date  ? Elevated prostate specific antigen (PSA)   ? Erectile dysfunction   ? [place]   ? Shortness of breath on exertion   ?  ? ?Family History  ?Problem Relation Age of Onset  ? Cancer Brother   ?     prostate  ? Cancer Sister   ?     breast  ? Cancer Brother   ?     colon  ?  ? ?Social History  ? ?Socioeconomic History  ? Marital status: Widowed  ?  Spouse name: Not on file  ? Number of children: Not on file  ? Years of education: Not on file  ?  Highest education level: Not on file  ?Occupational History  ? Not on file  ?Tobacco Use  ? Smoking status: Former  ?  Packs/day: 0.25  ?  Years: 50.00  ?  Pack years: 12.50  ?  Types: Cigarettes  ?  Quit date: 12/13/2021  ?  Years since quitting: 0.0  ? Smokeless tobacco: Never  ? Tobacco comments:  ?  PT CUT DOWN FROM 1 1/2 PPD TO 6 CIG DAILY  ?Substance and Sexual Activity  ? Alcohol use: No  ? Drug use: No  ? Sexual activity: Not on file  ?Other Topics Concern  ? Not on file  ?Social History Narrative  ? Not on file  ? ?Social Determinants of Health  ? ?Financial Resource Strain: Not on file  ?Food Insecurity: Not on file  ?Transportation Needs: Not on file  ?Physical Activity: Not on file   ?Stress: Not on file  ?Social Connections: Not on file  ?Intimate Partner Violence: Not on file  ?  ? ?Allergies  ?Allergen Reactions  ? Codeine Nausea And Vomiting  ?  ? ?Outpatient Medications Prior to Visit  ?Medication Sig Dispense Refill  ? albuterol (PROVENTIL HFA;VENTOLIN HFA) 108 (90 BASE) MCG/ACT inhaler Inhale 2 puffs into the lungs every 6 (six) hours as needed for wheezing.    ? albuterol (PROVENTIL) (2.5 MG/3ML) 0.083% nebulizer solution Take 3 mLs (2.5 mg total) by nebulization every 6 (six) hours as needed for wheezing or shortness of breath. 120 mL 12  ? budesonide-formoterol (SYMBICORT) 160-4.5 MCG/ACT inhaler Inhale 2 puffs into the lungs 2 (two) times daily.    ? ?No facility-administered medications prior to visit.  ? ?Review of Systems  ?Constitutional:  Negative for chills, fever, malaise/fatigue and weight loss.  ?HENT:  Negative for congestion, sinus pain and sore throat.   ?Eyes: Negative.   ?Respiratory:  Positive for shortness of breath. Negative for cough, hemoptysis, sputum production and wheezing.   ?Cardiovascular:  Negative for chest pain, palpitations, orthopnea, claudication and leg swelling.  ?Gastrointestinal:  Negative for abdominal pain, heartburn, nausea and vomiting.  ?Genitourinary: Negative.   ?Musculoskeletal:  Negative for joint pain and myalgias.  ?Skin:  Negative for rash.  ?Neurological:  Negative for weakness.  ?Endo/Heme/Allergies: Negative.   ?Psychiatric/Behavioral: Negative.    ? ?Objective:  ? ?Vitals:  ? 01/10/22 0930  ?BP: 122/74  ?Pulse: 87  ?SpO2: 96%  ?Weight: 167 lb (75.8 kg)  ?Height: 5' 6.5" (1.689 m)  ? ?Physical Exam ?Constitutional:   ?   General: He is not in acute distress. ?HENT:  ?   Head: Normocephalic and atraumatic.  ?Eyes:  ?   Conjunctiva/sclera: Conjunctivae normal.  ?Cardiovascular:  ?   Rate and Rhythm: Normal rate and regular rhythm.  ?   Pulses: Normal pulses.  ?   Heart sounds: Normal heart sounds. No murmur heard. ?Pulmonary:  ?    Effort: Pulmonary effort is normal.  ?   Breath sounds: Decreased air movement present. Decreased breath sounds present. No wheezing, rhonchi or rales.  ?Musculoskeletal:  ?   Right lower leg: No edema.  ?   Left lower leg: No edema.  ?Skin: ?   General: Skin is warm and dry.  ?Neurological:  ?   General: No focal deficit present.  ?   Mental Status: He is alert.  ?Psychiatric:     ?   Mood and Affect: Mood normal.     ?   Behavior: Behavior normal.     ?  Thought Content: Thought content normal.     ?   Judgment: Judgment normal.  ? ?CBC ?   ?Component Value Date/Time  ? WBC 3.7 (L) 07/14/2013 0740  ? RBC 2.82 (L) 07/14/2013 0740  ? HGB 8.7 (L) 07/14/2013 0740  ? HCT 25.3 (L) 07/14/2013 0740  ? PLT 185 07/14/2013 0740  ? MCV 89.7 07/14/2013 0740  ? MCH 30.9 07/14/2013 0740  ? MCHC 34.4 07/14/2013 0740  ? RDW 13.7 07/14/2013 0740  ? ?BMP Latest Ref Rng & Units 07/11/2013 07/10/2013 05/21/2013  ?Glucose 70 - 99 mg/dL 110(H) 150(H) 139(H)  ?BUN 6 - 23 mg/dL 40(H) 46(H) 16  ?Creatinine 0.50 - 1.35 mg/dL 0.70 0.73 0.81  ?Sodium 135 - 145 mEq/L 137 135 139  ?Potassium 3.5 - 5.1 mEq/L 3.8 3.8 3.7  ?Chloride 96 - 112 mEq/L 105 103 100  ?CO2 19 - 32 mEq/L '25 23 30  '$ ?Calcium 8.4 - 10.5 mg/dL 8.1(L) 9.0 9.7  ? ?Chest imaging: ?CT Chest 01/06/22 ?1. Persistent finding of a spiculated irregular nodule in the right ?upper lung measuring up to 1.2 cm diameter. Cavitation is present ?but less prominent than on the prior study. Consider PET-CT versus ?tissue sampling for further evaluation. ?2. Emphysematous changes in the lungs. ?3. Aortic atherosclerosis. ? ?HRCT Chest 10/21/21 ?1. No evidence of fibrotic interstitial lung disease. ?2. Emphysema and diffuse bilateral bronchial wall thickening. ?3. There is a small thick-walled cavitary lesion of the right ?pulmonary apex measuring 1.1 x 0.9 cm. This is nonspecific and may ?be infectious or inflammatory although suspicious for malignancy. ?Consider one of the following in 3 months  for both low-risk and ?high-risk individuals: (a) repeat chest CT, (b) follow-up PET-CT, or ?(c) tissue sampling.  ? ?CT Chest 05/12/13 ?There is no pleural effusion.  There is moderate  ?centrilobular emphysema.  3 mm

## 2022-01-12 ENCOUNTER — Other Ambulatory Visit: Payer: Self-pay | Admitting: Emergency Medicine

## 2022-01-12 NOTE — Progress Notes (Signed)
Arranging for Robotic assisted navigation bronchoscopy, goal 01/23/22 ?

## 2022-01-13 ENCOUNTER — Encounter: Payer: Self-pay | Admitting: Emergency Medicine

## 2022-01-19 ENCOUNTER — Other Ambulatory Visit: Payer: Self-pay

## 2022-01-19 ENCOUNTER — Encounter (HOSPITAL_COMMUNITY): Payer: Self-pay | Admitting: Emergency Medicine

## 2022-01-19 NOTE — Progress Notes (Signed)
Spoke with pt for pre-op call. Pt denies cardiac history, HTN or Diabetes. Pt is treated for COPD with inhalers.  ?Pt will get a Covid test done on Friday, 01/20/22. Instructed him to wear his mask out in public after getting the test done. He voiced understanding.  ?

## 2022-01-20 ENCOUNTER — Other Ambulatory Visit: Payer: Self-pay | Admitting: Emergency Medicine

## 2022-01-20 ENCOUNTER — Telehealth: Payer: Self-pay | Admitting: Emergency Medicine

## 2022-01-20 DIAGNOSIS — U071 COVID-19: Secondary | ICD-10-CM

## 2022-01-20 HISTORY — DX: COVID-19: U07.1

## 2022-01-20 LAB — SARS CORONAVIRUS 2 (TAT 6-24 HRS): SARS Coronavirus 2: POSITIVE — AB

## 2022-01-20 NOTE — Progress Notes (Signed)
Notified Dr. Lamonte Sakai at [phone] that patient tested positive for covid today.  ?

## 2022-01-20 NOTE — Telephone Encounter (Signed)
Notified by preop that the patient's COVID test came back positive.  I do not see any record that he has had recent COVID so must presume that this is a true positive.  His bronchoscopy will be postponed.  Will attempt to contact him to discuss ? ?I spoke with the patient's daughter Jackelyn Poling and notified her of the patient's positive test.  She confirmed that he is currently asymptomatic.  We will plan to postpone, may be able to reschedule him for the next week as long as he does not get symptoms.  We will tentatively plan to do so.  I did advise them that he needs to be social distancing for the next 5 days. ? ?Please work with endoscopy to reschedule the patient's bronchoscopy for 01/30/2022.  Thank you ?

## 2022-01-21 IMAGING — CT CT CHEST HIGH RESOLUTION
2 of 7 series · 14 of 36 positions shown, 17 images · non-contrast
Comparison: None.

CLINICAL DATA: Shortness of breath, history of emphysema

EXAM:
CT CHEST WITHOUT CONTRAST
TECHNIQUE: Multidetector CT imaging of the chest was performed following the
standard protocol without intravenous contrast. High resolution
imaging of the lungs, as well as inspiratory and expiratory imaging,
was performed.

[Series 6: coronal · coronal · 0.68mm/px · 3 of 151 slices shown]
[im 31/151  lung]
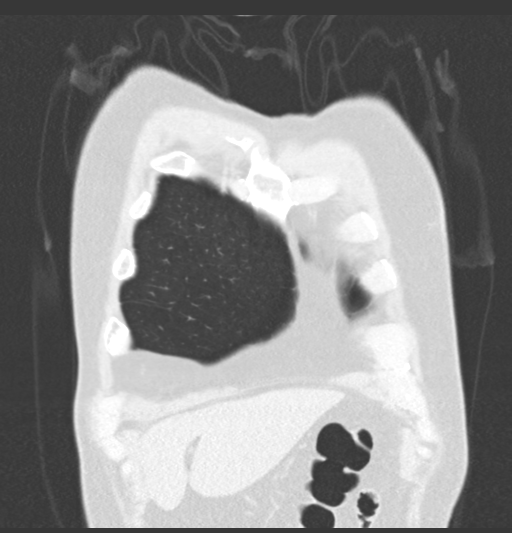
[im 61/151  lung]
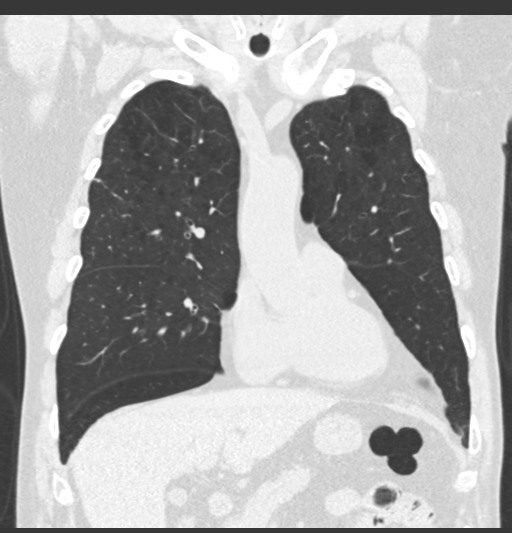
[im 91/151  lung]
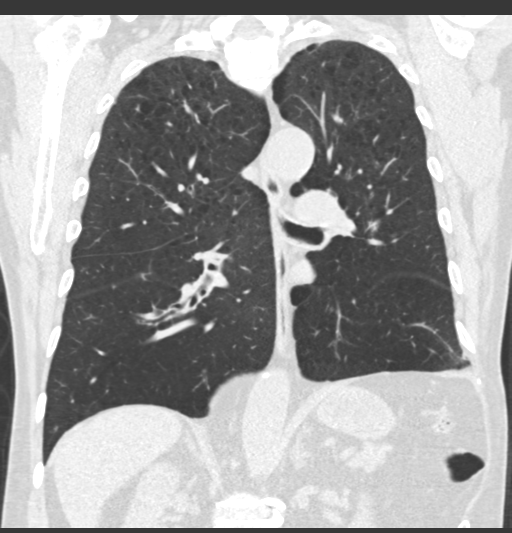

[Series 8: high resolution · axial · 0.72mm/px · z∈[+1288,+1578]mm · 11 of 348 slices shown, 14 images]
[im 29/348  mediastinal]
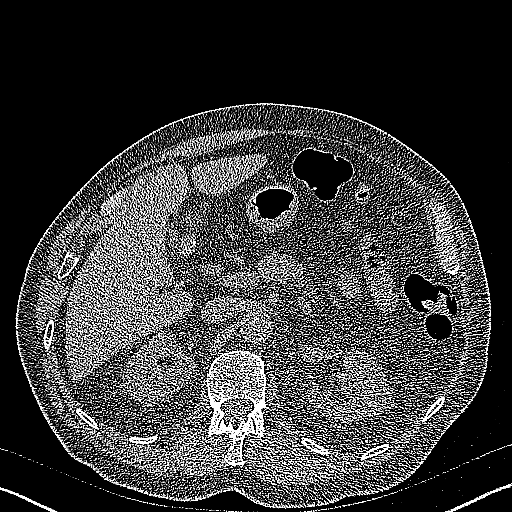
[im 29/348  lung]
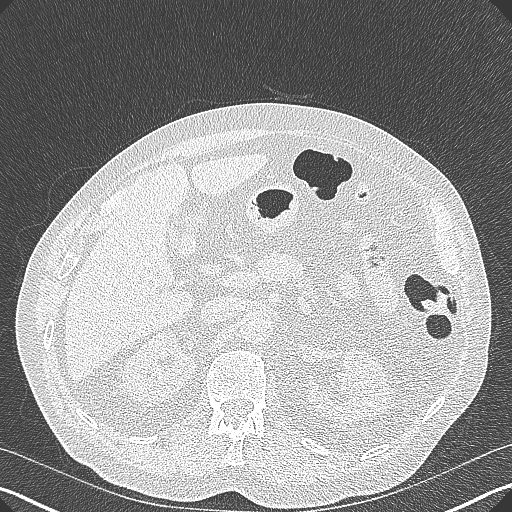
[im 58/348  lung]
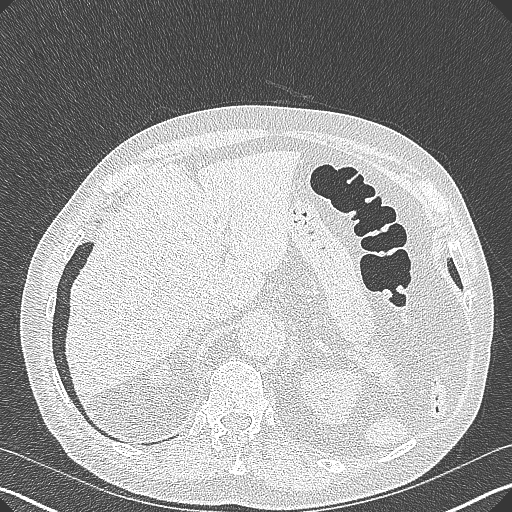
[im 87/348  lung]
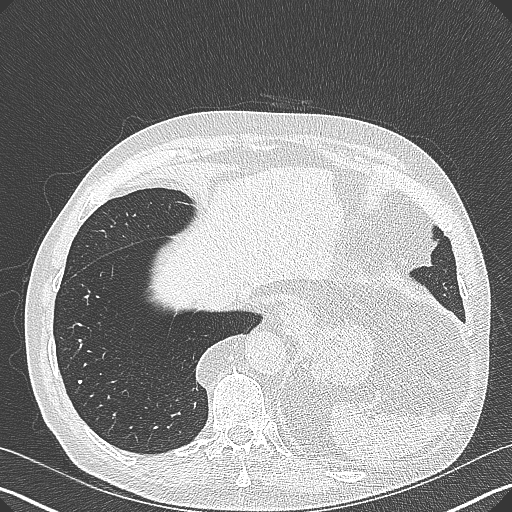
[im 116/348  lung]
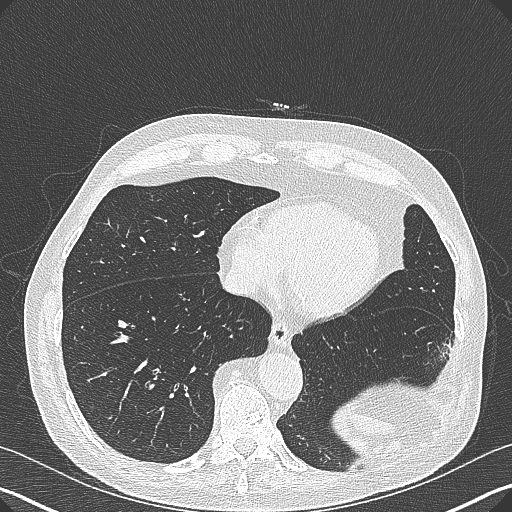
[im 145/348  mediastinal]
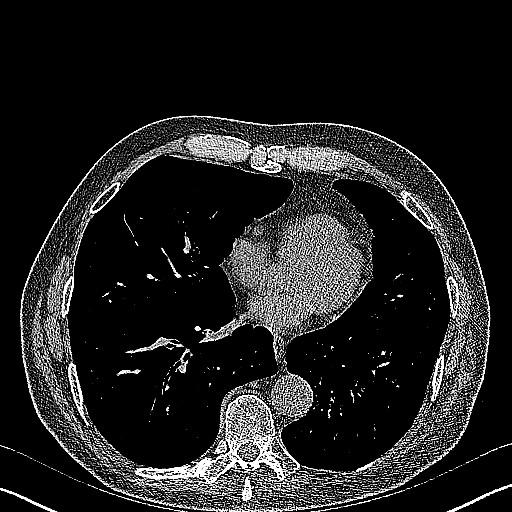
[im 145/348  lung]
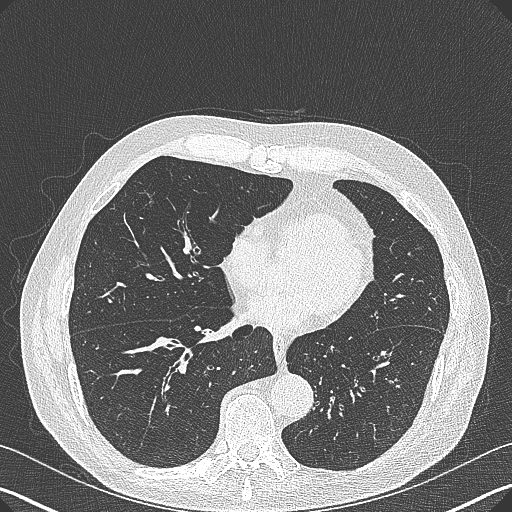
[im 174/348  lung]
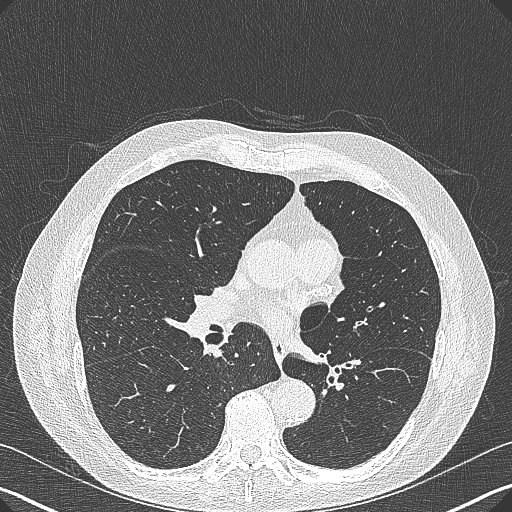
[im 203/348  lung]
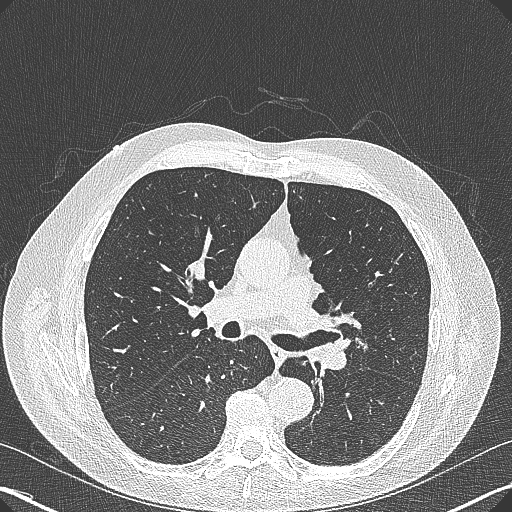
[im 232/348  lung]
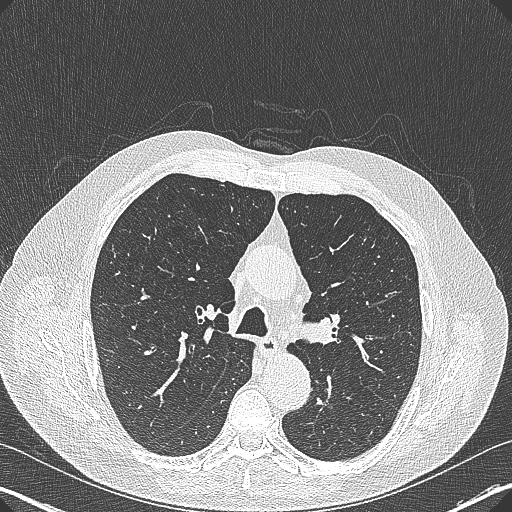
[im 261/348  mediastinal]
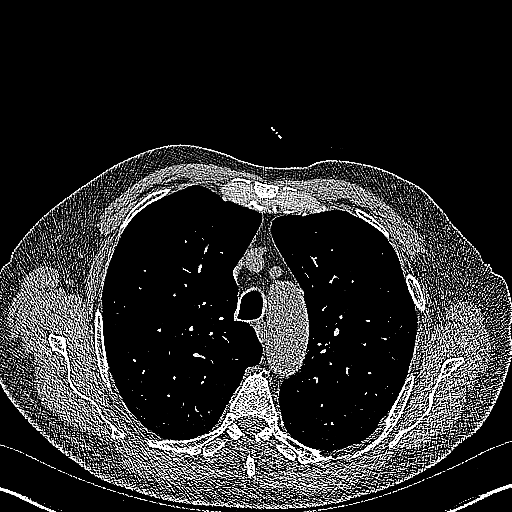
[im 261/348  lung]
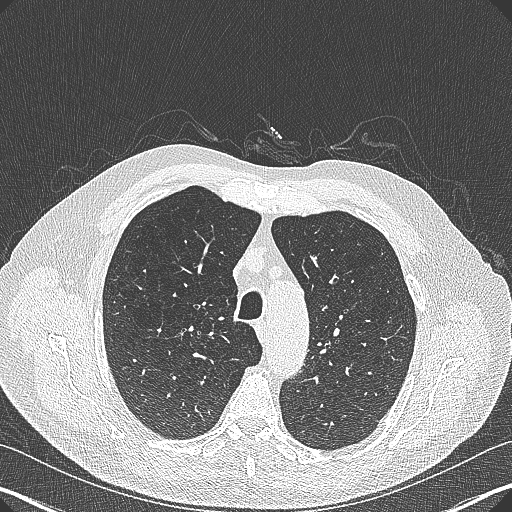
[im 290/348  lung]
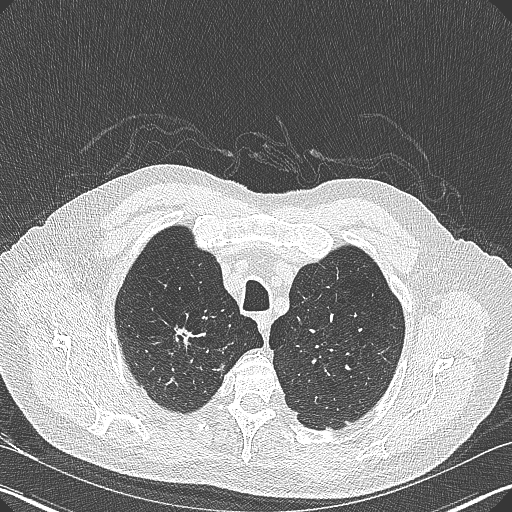
[im 319/348  lung]
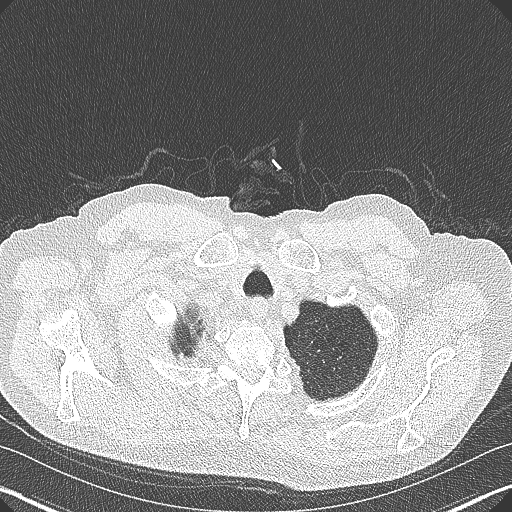

[14 of 36 positions shown; findings below may reference images not displayed]

FINDINGS: Cardiovascular: Aortic atherosclerosis. Normal heart size.
Three-vessel coronary artery calcifications. No pericardial
effusion.

Mediastinum/Nodes: No enlarged mediastinal, hilar, or axillary lymph
nodes. Thyroid gland, trachea, and esophagus demonstrate no
significant findings.

Lungs/Pleura: Moderate centrilobular and paraseptal emphysema.
Diffuse bilateral bronchial wall thickening. No significant air
trapping on expiratory phase imaging. There is a small thick-walled
cavitary lesion of the right pulmonary apex measuring 1.1 x 0.9 cm
(series 8, image 62). Eventration of the posterior left
hemidiaphragm. No pleural effusion or pneumothorax.

Upper Abdomen: No acute abnormality.

Musculoskeletal: No chest wall mass or suspicious bone lesions
identified. Surgical resection of the left 90 rib.
IMPRESSION: 1. No evidence of fibrotic interstitial lung disease.
2. Emphysema and diffuse bilateral bronchial wall thickening.
3. There is a small thick-walled cavitary lesion of the right
pulmonary apex measuring 1.1 x 0.9 cm. This is nonspecific and may
be infectious or inflammatory although suspicious for malignancy.
Consider one of the following in 3 months for both low-risk and
high-risk individuals: (a) repeat chest CT, (b) follow-up PET-CT, or
(c) tissue sampling. This recommendation follows the consensus
statement: Guidelines for Management of Incidental Pulmonary Nodules
Detected on CT Images: From the [HOSPITAL] 4389; Radiology
4389; [DATE].
4. Coronary artery disease.

Aortic Atherosclerosis (Q65NB-AH9.9) and Emphysema (Q65NB-KVB.2).

## 2022-01-23 ENCOUNTER — Ambulatory Visit (HOSPITAL_COMMUNITY): Admission: RE | Admit: 2022-01-23 | Payer: Medicare Other | Source: Home / Self Care | Admitting: Emergency Medicine

## 2022-01-23 HISTORY — DX: Chronic obstructive pulmonary disease, unspecified: J44.9

## 2022-01-23 HISTORY — DX: Headache, unspecified: R51.9

## 2022-01-23 SURGERY — BRONCHOSCOPY, WITH BIOPSY USING ELECTROMAGNETIC NAVIGATION
Anesthesia: General | Laterality: Right

## 2022-01-24 ENCOUNTER — Encounter: Payer: Self-pay | Admitting: Emergency Medicine

## 2022-01-24 NOTE — Telephone Encounter (Signed)
Hi - Can I try to get this gentleman rescheduled for 01/06/22?  ?My 4th slot on 4/3 is going to be filled.  ?Thanks very much ?

## 2022-01-24 NOTE — Telephone Encounter (Signed)
Pt has been resc for 02/06/2022 arrive at 8:30am Case# [id number] pt aware of new appt date and time ?

## 2022-01-25 NOTE — Telephone Encounter (Signed)
Called and spoke with pt's daughter letting her know that pt's scheduled bronch is the soonest he could be done and she verbalized understanding. Nothing further needed. ?

## 2022-01-25 NOTE — Telephone Encounter (Signed)
Brent Phelps daughter would like to know if Bronch procedure could be scheduled sooner. Brent Phelps phone number is [phone]. ?

## 2022-01-25 NOTE — Telephone Encounter (Signed)
I'm sorry, this is as early as I can get it done ?

## 2022-01-25 NOTE — Telephone Encounter (Signed)
Unable to speak with Brent Phelps as she is not listed on DPR. She is aware that I will contact pt. ?Spoke to patient. He is wanting to know if bx could be performed sooner then 02/06/22, ? ?Dr. Lamonte Sakai, please advise. Thanks ? ?

## 2022-02-02 ENCOUNTER — Encounter (HOSPITAL_COMMUNITY): Payer: Self-pay | Admitting: Emergency Medicine

## 2022-02-02 NOTE — Progress Notes (Signed)
Spoke with pt for pre-op call. Pt was originally scheduled for this procedure in March, but tested positive for Covid on 01/20/22. He states he had no symptoms at all. I had spoken with him for the first procedure and he states nothing has changed with his allergies, medications, medical or surgical history. Pt updated with mask requirements. He voiced understanding.  ? ?Shower instructions given to pt.  ?

## 2022-02-06 ENCOUNTER — Encounter (HOSPITAL_COMMUNITY)
Admission: RE | Disposition: A | Discharge status: Home / Self Care | Payer: Self-pay | Source: Home / Self Care | Attending: Emergency Medicine

## 2022-02-06 ENCOUNTER — Ambulatory Visit (HOSPITAL_COMMUNITY): Payer: Medicare Other | Admitting: Anesthesiology

## 2022-02-06 ENCOUNTER — Ambulatory Visit (HOSPITAL_COMMUNITY): Payer: Medicare Other

## 2022-02-06 ENCOUNTER — Ambulatory Visit (HOSPITAL_COMMUNITY)
Admission: RE | Admit: 2022-02-06 | Discharge: 2022-02-06 | Disposition: A | Discharge status: Home / Self Care | Payer: Medicare Other | Attending: Emergency Medicine | Admitting: Emergency Medicine

## 2022-02-06 ENCOUNTER — Other Ambulatory Visit: Payer: Self-pay

## 2022-02-06 ENCOUNTER — Ambulatory Visit (HOSPITAL_BASED_OUTPATIENT_CLINIC_OR_DEPARTMENT_OTHER): Payer: Medicare Other | Admitting: Anesthesiology

## 2022-02-06 ENCOUNTER — Encounter (HOSPITAL_COMMUNITY): Payer: Self-pay | Admitting: Emergency Medicine

## 2022-02-06 DIAGNOSIS — Z7951 Long term (current) use of inhaled steroids: Secondary | ICD-10-CM | POA: Diagnosis not present

## 2022-02-06 DIAGNOSIS — Z8546 Personal history of malignant neoplasm of prostate: Secondary | ICD-10-CM | POA: Insufficient documentation

## 2022-02-06 DIAGNOSIS — R911 Solitary pulmonary nodule: Secondary | ICD-10-CM

## 2022-02-06 DIAGNOSIS — K279 Peptic ulcer, site unspecified, unspecified as acute or chronic, without hemorrhage or perforation: Secondary | ICD-10-CM

## 2022-02-06 DIAGNOSIS — F17210 Nicotine dependence, cigarettes, uncomplicated: Secondary | ICD-10-CM | POA: Insufficient documentation

## 2022-02-06 DIAGNOSIS — R918 Other nonspecific abnormal finding of lung field: Secondary | ICD-10-CM | POA: Diagnosis not present

## 2022-02-06 DIAGNOSIS — J449 Chronic obstructive pulmonary disease, unspecified: Secondary | ICD-10-CM

## 2022-02-06 DIAGNOSIS — C61 Malignant neoplasm of prostate: Secondary | ICD-10-CM

## 2022-02-06 DIAGNOSIS — J984 Other disorders of lung: Secondary | ICD-10-CM | POA: Diagnosis not present

## 2022-02-06 HISTORY — PX: BRONCHIAL NEEDLE ASPIRATION BIOPSY: SHX5106

## 2022-02-06 HISTORY — PX: VIDEO BRONCHOSCOPY WITH RADIAL ENDOBRONCHIAL ULTRASOUND: SHX6849

## 2022-02-06 HISTORY — PX: BRONCHIAL BIOPSY: SHX5109

## 2022-02-06 HISTORY — PX: BRONCHIAL BRUSHINGS: SHX5108

## 2022-02-06 HISTORY — PX: BRONCHIAL WASHINGS: SHX5105

## 2022-02-06 HISTORY — PX: FIDUCIAL MARKER PLACEMENT: SHX6858

## 2022-02-06 LAB — CBC
HCT: 43.9 % (ref 39.0–52.0)
Hemoglobin: 14.7 g/dL (ref 13.0–17.0)
MCH: 32.2 pg (ref 26.0–34.0)
MCHC: 33.5 g/dL (ref 30.0–36.0)
MCV: 96.1 fL (ref 80.0–100.0)
Platelets: 167 10*3/uL (ref 150–400)
RBC: 4.57 MIL/uL (ref 4.22–5.81)
RDW: 12.9 % (ref 11.5–15.5)
WBC: 4.2 10*3/uL (ref 4.0–10.5)
nRBC: 0 % (ref 0.0–0.2)

## 2022-02-06 LAB — BASIC METABOLIC PANEL
Anion gap: 6 (ref 5–15)
BUN: 14 mg/dL (ref 8–23)
CO2: 26 mmol/L (ref 22–32)
Calcium: 9.1 mg/dL (ref 8.9–10.3)
Chloride: 108 mmol/L (ref 98–111)
Creatinine, Ser: 0.96 mg/dL (ref 0.61–1.24)
GFR, Estimated: >60 mL/min (ref >60)
Glucose, Bld: 92 mg/dL (ref 70–99)
Potassium: 4.5 mmol/L (ref 3.5–5.1)
Sodium: 140 mmol/L (ref 135–145)

## 2022-02-06 SURGERY — BRONCHOSCOPY, WITH BIOPSY USING ELECTROMAGNETIC NAVIGATION
Anesthesia: General | Laterality: Right

## 2022-02-06 MED ORDER — OXYCODONE HCL 5 MG PO TABS: 5.0000 mg | ORAL_TABLET | Freq: Once | ORAL | Status: DC | PRN

## 2022-02-06 MED ORDER — ONDANSETRON HCL 4 MG/2ML IJ SOLN
INTRAMUSCULAR | Status: DC | PRN
  Administered 2022-02-06: 4 mg via INTRAVENOUS

## 2022-02-06 MED ORDER — LACTATED RINGERS IV SOLN: INTRAVENOUS | Status: DC

## 2022-02-06 MED ORDER — PROPOFOL 10 MG/ML IV BOLUS
INTRAVENOUS | Status: DC | PRN
  Administered 2022-02-06: 130 mg via INTRAVENOUS
  Administered 2022-02-06: 30 mg via INTRAVENOUS

## 2022-02-06 MED ORDER — OXYCODONE HCL 5 MG/5ML PO SOLN: 5.0000 mg | Freq: Once | ORAL | Status: DC | PRN

## 2022-02-06 MED ORDER — CHLORHEXIDINE GLUCONATE 0.12 % MT SOLN
15.0000 mL | Freq: Once | OROMUCOSAL | Status: AC
  Filled 2022-02-06: qty 15

## 2022-02-06 MED ORDER — FENTANYL CITRATE (PF) 250 MCG/5ML IJ SOLN
INTRAMUSCULAR | Status: DC | PRN
  Administered 2022-02-06 (×2): 50 ug via INTRAVENOUS

## 2022-02-06 MED ORDER — ONDANSETRON HCL 4 MG/2ML IJ SOLN: 4.0000 mg | Freq: Once | INTRAMUSCULAR | Status: DC | PRN

## 2022-02-06 MED ORDER — ROCURONIUM BROMIDE 10 MG/ML (PF) SYRINGE
PREFILLED_SYRINGE | INTRAVENOUS | Status: DC | PRN
Start: 2022-02-06 — End: 2022-02-06
  Administered 2022-02-06: 50 mg via INTRAVENOUS

## 2022-02-06 MED ORDER — LIDOCAINE 2% (20 MG/ML) 5 ML SYRINGE
INTRAMUSCULAR | Status: DC | PRN
  Administered 2022-02-06: 60 mg via INTRAVENOUS

## 2022-02-06 MED ORDER — SUGAMMADEX SODIUM 200 MG/2ML IV SOLN
INTRAVENOUS | Status: DC | PRN
  Administered 2022-02-06: 200 mg via INTRAVENOUS

## 2022-02-06 MED ORDER — CHLORHEXIDINE GLUCONATE 0.12 % MT SOLN
OROMUCOSAL | Status: AC
  Administered 2022-02-06: 15 mL via OROMUCOSAL
  Filled 2022-02-06: qty 15

## 2022-02-06 MED ORDER — PHENYLEPHRINE 40 MCG/ML (10ML) SYRINGE FOR IV PUSH (FOR BLOOD PRESSURE SUPPORT)
PREFILLED_SYRINGE | INTRAVENOUS | Status: DC | PRN
  Administered 2022-02-06: 80 ug via INTRAVENOUS

## 2022-02-06 MED ORDER — FENTANYL CITRATE (PF) 100 MCG/2ML IJ SOLN: 25.0000 ug | INTRAMUSCULAR | Status: DC | PRN

## 2022-02-06 MED ORDER — DEXAMETHASONE SODIUM PHOSPHATE 10 MG/ML IJ SOLN
INTRAMUSCULAR | Status: DC | PRN
  Administered 2022-02-06: 5 mg via INTRAVENOUS

## 2022-02-06 SURGICAL SUPPLY — 1 items: SuperLock Fiducial Marker ×1 IMPLANT

## 2022-02-06 NOTE — Transfer of Care (Signed)
Immediate Anesthesia Transfer of Care Note ? ?Patient: Brent Phelps ? ?Procedure(s) Performed: ROBOTIC ASSISTED NAVIGATIONAL BRONCHOSCOPY (Right) ?VIDEO BRONCHOSCOPY WITH RADIAL ENDOBRONCHIAL ULTRASOUND ?BRONCHIAL BRUSHINGS ?BRONCHIAL NEEDLE ASPIRATION BIOPSIES ?BRONCHIAL BIOPSIES ?FIDUCIAL MARKER PLACEMENT ?BRONCHIAL WASHINGS ? ?Patient Location: PACU ? ?Anesthesia Type:General ? ?Level of Consciousness: awake, oriented and patient cooperative ? ?Airway & Oxygen Therapy: Patient Spontanous Breathing and Patient connected to nasal cannula oxygen ? ?Post-op Assessment: Report given to RN and Post -op Vital signs reviewed and stable ? ?Post vital signs: Reviewed ? ?Last Vitals:  ?Vitals Value Taken Time  ?BP 137/80 02/06/22 1253  ?Temp    ?Pulse 100 02/06/22 1254  ?Resp 23 02/06/22 1254  ?SpO2 92 % 02/06/22 1254  ?Vitals shown include unvalidated device data. ? ?Last Pain:  ?Vitals:  ? 02/06/22 0857  ?PainSc: 0-No pain  ?   ? ?  ? ?Complications: No notable events documented. ?

## 2022-02-06 NOTE — Anesthesia Preprocedure Evaluation (Addendum)
Anesthesia Evaluation  ?Patient identified by MRN, date of birth, ID band ?Patient awake ? ? ? ?Reviewed: ?Allergy & Precautions, NPO status , Patient's Chart, lab work & pertinent test results ? ?Airway ?Mallampati: II ? ?TM Distance: >3 FB ?Neck ROM: Full ? ? ? Dental ? ?(+) Dental Advisory Given ?  ?Pulmonary ?COPD,  COPD inhaler, Current Smoker and Patient abstained from smoking.,  ? ?RUL nodule ? ?  ?Pulmonary exam normal ? ? ? ? ? ? ? Cardiovascular ?Normal cardiovascular exam ? ? ?  ?Neuro/Psych ? Headaches,   ? GI/Hepatic ?PUD,   ?Endo/Other  ? ? Renal/GU ?  ? ? ?Prostate cancer ? ? ?  ?Musculoskeletal ? ? Abdominal ?  ?Peds ? Hematology ?  ?Anesthesia Other Findings ? ? Reproductive/Obstetrics ? ?  ? ? ? ? ? ? ? ? ? ? ? ? ? ?  ?  ? ? ? ? ? ? ? ?Anesthesia Physical ?Anesthesia Plan ? ?ASA: 3 ? ?Anesthesia Plan: General  ? ?Post-op Pain Management:   ? ?Induction: Intravenous ? ?PONV Risk Score and Plan: 2 and Treatment may vary due to age or medical condition, Ondansetron and Dexamethasone ? ?Airway Management Planned: Oral ETT ? ?Additional Equipment: None ? ?Intra-op Plan:  ? ?Post-operative Plan: Extubation in OR ? ?Informed Consent: I have reviewed the patients History and Physical, chart, labs and discussed the procedure including the risks, benefits and alternatives for the proposed anesthesia with the patient or authorized representative who has indicated his/her understanding and acceptance.  ? ? ? ?Dental advisory given ? ?Plan Discussed with: CRNA and Anesthesiologist ? ?Anesthesia Plan Comments:   ? ? ? ? ? ?Anesthesia Quick Evaluation ? ?

## 2022-02-06 NOTE — Anesthesia Postprocedure Evaluation (Signed)
Anesthesia Post Note ? ?Patient: Brent Phelps ? ?Procedure(s) Performed: ROBOTIC ASSISTED NAVIGATIONAL BRONCHOSCOPY (Right) ?VIDEO BRONCHOSCOPY WITH RADIAL ENDOBRONCHIAL ULTRASOUND ?BRONCHIAL BRUSHINGS ?BRONCHIAL NEEDLE ASPIRATION BIOPSIES ?BRONCHIAL BIOPSIES ?FIDUCIAL MARKER PLACEMENT ?BRONCHIAL WASHINGS ? ?  ? ?Patient location during evaluation: PACU ?Anesthesia Type: General ?Level of consciousness: awake and alert ?Pain management: pain level controlled ?Vital Signs Assessment: post-procedure vital signs reviewed and stable ?Respiratory status: spontaneous breathing, nonlabored ventilation and respiratory function stable ?Cardiovascular status: stable and blood pressure returned to baseline ?Anesthetic complications: no ? ? ?No notable events documented. ? ?Last Vitals:  ?Vitals:  ? 02/06/22 1308 02/06/22 1318  ?BP: 119/74 128/75  ?Pulse: 93 91  ?Resp: 20 (!) 24  ?Temp:  37.1 ?C  ?SpO2: 91% 97%  ?  ?Last Pain:  ?Vitals:  ? 02/06/22 1318  ?PainSc: 0-No pain  ? ? ?  ?  ?  ?  ?  ?  ? ?Audry Pili ? ? ? ? ?

## 2022-02-06 NOTE — Anesthesia Procedure Notes (Signed)
Procedure Name: Intubation ?Date/Time: 02/06/2022 11:37 AM ?Performed by: Mariea Clonts, CRNA ?Pre-anesthesia Checklist: Patient identified, Emergency Drugs available, Suction available and Patient being monitored ?Patient Re-evaluated:Patient Re-evaluated prior to induction ?Oxygen Delivery Method: Circle System Utilized ?Preoxygenation: Pre-oxygenation with 100% oxygen ?Induction Type: IV induction ?Ventilation: Mask ventilation without difficulty ?Laryngoscope Size: Sabra Heck and 2 ?Grade View: Grade II ?Tube type: Oral ?Tube size: 8.5 mm ?Number of attempts: 1 ?Airway Equipment and Method: Stylet and Oral airway ?Placement Confirmation: ETT inserted through vocal cords under direct vision, positive ETCO2 and breath sounds checked- equal and bilateral ?Tube secured with: Tape ?Dental Injury: Teeth and Oropharynx as per pre-operative assessment  ? ? ? ? ?

## 2022-02-06 NOTE — Op Note (Signed)
Video Bronchoscopy with Robotic Assisted Bronchoscopic Navigation  ? ?Date of Operation: 02/06/2022  ? ?Pre-op Diagnosis: Right upper lobe nodule ? ?Post-op Diagnosis: Same ? ?Surgeon: Baltazar Apo ? ?Assistants: None ? ?Anesthesia: General endotracheal anesthesia ? ?Operation: Flexible video fiberoptic bronchoscopy with robotic assistance and biopsies. ? ?Estimated Blood Loss: Minimal ? ?Complications: None ? ?Indications and History: ?Brent Phelps is a 76 y.o. male with history of tobacco use, prostate cancer.  Has been followed by Dr. Erin Fulling for a right upper lobe pulmonary nodule.  Recommendation made to achieve a tissue diagnosis via robotic assisted navigational bronchoscopy. The risks, benefits, complications, treatment options and expected outcomes were discussed with the patient.  The possibilities of pneumothorax, pneumonia, reaction to medication, pulmonary aspiration, perforation of a viscus, bleeding, failure to diagnose a condition and creating a complication requiring transfusion or operation were discussed with the patient who freely signed the consent.   ? ?Description of Procedure: ?The patient was seen in the Preoperative Area, was examined and was deemed appropriate to proceed.  The patient was taken to [facility] endoscopy room 3, identified as [name] and the procedure verified as Flexible Video Fiberoptic Bronchoscopy.  A Time Out was held and the above information confirmed.  ? ?Prior to the date of the procedure a high-resolution CT scan of the chest was performed. Utilizing ION software program a virtual tracheobronchial tree was generated to allow the creation of distinct navigation pathways to the patient's parenchymal abnormalities. After being taken to the operating room general anesthesia was initiated and the patient  was orally intubated. The video fiberoptic bronchoscope was introduced via the endotracheal tube and a general inspection was performed which showed normal right and  left lung anatomy. Aspiration of the bilateral mainstems was completed to remove any remaining secretions. Robotic catheter inserted into patient's endotracheal tube.  ? ?Target #1 right upper lobe nodule: ?The distinct navigation pathways prepared prior to this procedure were then utilized to navigate to patient's lesion identified on CT scan. The robotic catheter was secured into place and the vision probe was withdrawn.  Lesion location was approximated using fluoroscopy and radial endobronchial ultrasound for peripheral targeting.  Local registration and targeting was performed using Cios three-dimensional imaging.  Needle and lesion was confirmed with Cios.  Under fluoroscopic guidance transbronchial needle brushings, transbronchial needle biopsies, and transbronchial forceps biopsies were performed to be sent for cytology and pathology.  Under fluoroscopic guidance a single fiducial marker was placed adjacent to the nodule.  A bronchioalveolar lavage was performed in the right upper lobe and sent for cytology. ? ?At the end of the procedure a general airway inspection was performed and there was no evidence of active bleeding. The bronchoscope was removed.  The patient tolerated the procedure well. There was no significant blood loss and there were no obvious complications. A post-procedural chest x-ray is pending. ? ?Samples Target #1: ?1. Transbronchial needle brushings from right upper lobe nodule ?2. Transbronchial Wang needle biopsies from right upper lobe nodule ?3. Transbronchial forceps biopsies from right upper lobe nodule ?4. Bronchoalveolar lavage from right upper lobe ? ? ?Plans:  ?The patient will be discharged from the PACU to home when recovered from anesthesia and after chest x-ray is reviewed. We will review the cytology, pathology and microbiology results with the patient when they become available. Outpatient followup will be with Dr. Erin Fulling or Dr. Lamonte Sakai.  ? ? ?Baltazar Apo, MD,  PhD ?02/06/2022, 12:39 PM ?[facility] ?[phone] or if  no answer before 7:00PM call [phone] ?For any issues after 7:00PM please call eLink [phone] ? ? ?

## 2022-02-06 NOTE — Discharge Instructions (Signed)
Flexible Bronchoscopy, Care After ?This sheet gives you information about how to care for yourself after your test. Your doctor may also give you more specific instructions. If you have problems or questions, contact your doctor. ?Follow these instructions at home: ?Eating and drinking ?Do not eat or drink anything (not even water) for 2 hours after your test, or until your numbing medicine (local anesthetic) wears off. ?When your numbness is gone and your cough and gag reflexes have come back, you may: ?Eat only soft foods. ?Slowly drink liquids. ?The day after the test, go back to your normal diet. ?Driving ?Do not drive for 24 hours if you were given a medicine to help you relax (sedative). ?Do not drive or use heavy machinery while taking prescription pain medicine. ?General instructions ? ?Take over-the-counter and prescription medicines only as told by your doctor. ?Return to your normal activities as told. Ask what activities are safe for you. ?Do not use any products that have nicotine or tobacco in them. This includes cigarettes and e-cigarettes. If you need help quitting, ask your doctor. ?Keep all follow-up visits as told by your doctor. This is important. It is very important if you had a tissue sample (biopsy) taken. ?Get help right away if: ?You have shortness of breath that gets worse. ?You get light-headed. ?You feel like you are going to pass out (faint). ?You have chest pain. ?You cough up: ?More than a little blood. ?More blood than before. ?Summary ?Do not eat or drink anything (not even water) for 2 hours after your test, or until your numbing medicine wears off. ?Do not use cigarettes. Do not use e-cigarettes. ?Get help right away if you have chest pain. ? ?Please call our office for any questions or concerns.  [phone]. ? ?This information is not intended to replace advice given to you by your health care provider. Make sure you discuss any questions you have with your health care  provider. ?Document Released: 08/13/2009 Document Revised: 09/28/2017 Document Reviewed: 11/03/2016 ?Elsevier Patient Education ? [place]. ? ?

## 2022-02-06 NOTE — Interval H&P Note (Signed)
History and Physical Interval Note: ? ?02/06/2022 ?8:47 AM ? ?Brent Phelps  has presented today for surgery, with the diagnosis of RIGHT UPPER LOBE NODULE.  The various methods of treatment have been discussed with the patient and family. After consideration of risks, benefits and other options for treatment, the patient has consented to  Procedure(s): ?ROBOTIC ASSISTED NAVIGATIONAL BRONCHOSCOPY (Right) as a surgical intervention.  The patient's history has been reviewed, patient examined, no change in status, stable for surgery.  I have reviewed the patient's chart and labs.  Questions were answered to the patient's satisfaction.   ? ? ?[name] [name] ? ? ?

## 2022-02-07 ENCOUNTER — Encounter (HOSPITAL_COMMUNITY): Payer: Self-pay | Admitting: Emergency Medicine

## 2022-02-07 LAB — CYTOLOGY - NON PAP

## 2022-02-09 ENCOUNTER — Telehealth: Payer: Self-pay | Admitting: Emergency Medicine

## 2022-02-09 NOTE — Telephone Encounter (Signed)
Discussed pathology results with the patient's daughter Jackelyn Poling by phone.  The transbronchial sampling showed necrotic tissue but was not diagnostic of malignancy.  Certainly remain suspicious that this could reflect malignancy.  Explained to her that we will need to continue to follow with serial CTs, decide whether other diagnostics, repeat bronchoscopy, etc. are necessary depending on how the nodule changes over time. ? ?Will forward to Dr. Dessa Phi ?

## 2022-02-28 DIAGNOSIS — J449 Chronic obstructive pulmonary disease, unspecified: Secondary | ICD-10-CM | POA: Diagnosis not present

## 2022-03-14 ENCOUNTER — Encounter (INDEPENDENT_AMBULATORY_CARE_PROVIDER_SITE_OTHER): Payer: Self-pay | Admitting: Ophthalmology

## 2022-03-14 ENCOUNTER — Ambulatory Visit (INDEPENDENT_AMBULATORY_CARE_PROVIDER_SITE_OTHER): Payer: Medicare Other | Admitting: Ophthalmology

## 2022-03-14 DIAGNOSIS — H44522 Atrophy of globe, left eye: Secondary | ICD-10-CM

## 2022-03-14 DIAGNOSIS — H532 Diplopia: Secondary | ICD-10-CM | POA: Insufficient documentation

## 2022-03-14 DIAGNOSIS — H1712 Central corneal opacity, left eye: Secondary | ICD-10-CM | POA: Diagnosis not present

## 2022-03-14 DIAGNOSIS — H43811 Vitreous degeneration, right eye: Secondary | ICD-10-CM

## 2022-03-14 DIAGNOSIS — H2511 Age-related nuclear cataract, right eye: Secondary | ICD-10-CM | POA: Diagnosis not present

## 2022-03-14 NOTE — Assessment & Plan Note (Signed)
Cataract progression OU explains the patient's induced myopia allowing him to read without glasses. ? ?I explained to him that his occasional monocular diplopia is coming from unrefined or on corrected refraction likely some element of astigmatism particularly possibly lenticular induced or exacerbated ? ?I explained to the patient that he should seek refractive assistance, will refer to a local optometrist for his area, Dr. Teola Bradley and [facility] ?

## 2022-03-14 NOTE — Assessment & Plan Note (Signed)
While I currently phakic, likely induced refractive changes that is uncorrected with evanescent symptomatology.  Reassured the patient that his eyesight is okay and is normal for his condition and simply needs refractive assistance ?

## 2022-03-14 NOTE — Assessment & Plan Note (Signed)
Crenated appearance of the eye, comfortable eye left ?

## 2022-03-14 NOTE — Progress Notes (Signed)
? ? ?03/14/2022 ? ?  ? ?CHIEF COMPLAINT ?Patient presents for  ?Chief Complaint  ?Patient presents with  ? Retina Follow Up  ? ? 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 ? ?OD currently with complaints of monocular diplopia evanescent at times ? ?HISTORY OF PRESENT ILLNESS: ?Brent Phelps is a 76 y.o. male who presents to the clinic today for:  ? ?HPI   ? ? Retina Follow Up   ? ?      ? Diagnosis: Posterior Vitreous Detachment  ? Laterality: right eye  ? Onset: 1  ? ?  ?  ? ? Comments   ?1 yr FU OD, FP. ?Patient states "my vision is worse all the way around. Things are blurry, sometimes I see double." ?Patient reports he noticed the change around 4-5 months ago. ? ?  ?  ?Last edited by Laurin Coder on 03/14/2022  7:49 AM.  ?  ? ? ?Referring physician: ?[name], MD ?[address] ?[address] ?[place],  [place] [address] ? ?HISTORICAL INFORMATION:  ? ?Selected notes from the [facility] ?  ?   ? ?CURRENT MEDICATIONS: ?No current outpatient medications on file. (Ophthalmic Drugs)  ? ?No current facility-administered medications for this visit. (Ophthalmic Drugs)  ? ?Current Outpatient Medications (Other)  ?Medication Sig  ? albuterol (PROVENTIL HFA;VENTOLIN HFA) 108 (90 BASE) MCG/ACT inhaler Inhale 2 puffs into the lungs every 6 (six) hours as needed for wheezing.  ? albuterol (PROVENTIL) (2.5 MG/3ML) 0.083% nebulizer solution Take 3 mLs (2.5 mg total) by nebulization every 6 (six) hours as needed for wheezing or shortness of breath.  ? budesonide-formoterol (SYMBICORT) 160-4.5 MCG/ACT inhaler Inhale 2 puffs into the lungs 2 (two) times daily.  ? ?No current facility-administered medications for this visit. (Other)  ? ? ? ? ?REVIEW OF SYSTEMS: ?ROS   ?Negative for: Constitutional, Gastrointestinal, Neurological, Skin, Genitourinary, Musculoskeletal, HENT, Endocrine, Cardiovascular, Eyes, Respiratory, Psychiatric, Allergic/Imm,  Heme/Lymph ?Last edited by Hurman Horn, MD on 03/14/2022  8:44 AM.  ?  ? ? ? ?ALLERGIES ?Allergies  ?Allergen Reactions  ? Codeine Nausea And Vomiting  ?  Can take for a short time   ? ? ?PAST MEDICAL HISTORY ?Past Medical History:  ?Diagnosis Date  ? COPD (chronic obstructive pulmonary disease) ([place])   ? COVID 01/20/2022  ? pt states no symptoms  ? Elevated prostate specific antigen (PSA)   ? Erectile dysfunction   ? Headache   ? Prostate cancer ([place])   ? Shortness of breath on exertion   ? ?Past Surgical History:  ?Procedure Laterality Date  ? ANAL FISSURE REPAIR  AGE 6  ? BRONCHIAL BIOPSY  02/06/2022  ? Procedure: BRONCHIAL BIOPSIES;  Surgeon: Collene Gobble, MD;  Location: [facility] ENDOSCOPY;  Service: Pulmonary;;  ? BRONCHIAL BRUSHINGS  02/06/2022  ? Procedure: BRONCHIAL BRUSHINGS;  Surgeon: Collene Gobble, MD;  Location[facility] ENDOSCOPY;  Service: Pulmonary;;  ? BRONCHIAL NEEDLE ASPIRATION BIOPSY  02/06/2022  ? Procedure: BRONCHIAL NEEDLE ASPIRATION BIOPSIES;  Surgeon: Collene Gobble, MD;  Location: [facility] ENDOSCOPY;  Service: Pulmonary;;  ? BRONCHIAL WASHINGS  02/06/2022  ? Procedure: BRONCHIAL WASHINGS;  Surgeon: Collene Gobble, MD;  Location[facility]COPY;  Service: Pulmonary;;  ? CYSTOSCOPY  06/20/2012  ? Procedure: CYSTOSCOPY FLEXIBLE;  Surgeon: Ailene Rud, MD;  Location: [facility];  Service: Urology;  Laterality: N/A;  no seeds found in bladder  ? CYSTOSCOPY  06/20/2012  ?  Procedure: CYSTOSCOPY;  Surgeon: Ailene Rud, MD;  Location: [facility];  Service: Urology;;  ? ESOPHAGOGASTRODUODENOSCOPY N/A 07/11/2013  ? Procedure: ESOPHAGOGASTRODUODENOSCOPY (EGD);  Surgeon: Winfield Cunas., MD;  Location: [facility] ENDOSCOPY;  Service: Endoscopy;  Laterality: N/A;  ? FIDUCIAL MARKER PLACEMENT  02/06/2022  ? Procedure: FIDUCIAL MARKER PLACEMENT;  Surgeon: Collene Gobble, MD;  Location: [facility] ENDOSCOPY;  Service: Pulmonary;;  ? INJECTION OF SILICONE OIL Left 0/73/7106  ? Procedure:  INJECTION OF SILICONE OIL;  Surgeon: Hurman Horn, MD;  Location: [place];  Service: Ophthalmology;  Laterality: Left;  ? PARS PLANA VITRECTOMY Left 05/22/2013  ? Procedure: PARS PLANA VITRECTOMY WITH 20 GAUGE WITH  ENDO LASER AND SILICONE INJECTION LEFT EYE; Reconstruction Ruptured globe, Drainage subretinal hematoma; Evacuation of anterior chamber clot.;  Surgeon: Hurman Horn, MD;  Location: [facility];  Service: Ophthalmology;  Laterality: Left;  ? PHOTOCOAGULATION WITH LASER Left 05/22/2013  ? Procedure: PHOTOCOAGULATION WITH LASER;  Surgeon: Hurman Horn, MD;  Location: [facility];  Service: Ophthalmology;  Laterality: Left;  ? RADIOACTIVE SEED IMPLANT  06/20/2012  ? Procedure: RADIOACTIVE SEED IMPLANT;  Surgeon: Ailene Rud, MD;  Location: [facility];  Service: Urology;  Laterality: N/A;  83 seeds implanted  ? RUPTURED GLOBE EXPLORATION AND REPAIR Left 05/12/2013  ? Procedure: REPAIR OF RUPTURED GLOBE WITH CLOSURE OF LACERATION LOWER EYELID;  Surgeon: Dara Hoyer, MD;  Location: [place];  Service: Ophthalmology;  Laterality: Left;  ? THORACIC FUSION  1992    ? RE-DO T9 - T10 (PRIOR DISKECTOMY IN 1991)  ? VIDEO BRONCHOSCOPY WITH RADIAL ENDOBRONCHIAL ULTRASOUND  02/06/2022  ? Procedure: VIDEO BRONCHOSCOPY WITH RADIAL ENDOBRONCHIAL ULTRASOUND;  Surgeon: Collene Gobble, MD;  Location: [facility] ENDOSCOPY;  Service: Pulmonary;;  ? ? ?FAMILY HISTORY ?Family History  ?Problem Relation Age of Onset  ? Cancer Brother   ?     prostate  ? Cancer Sister   ?     breast  ? Cancer Brother   ?     colon  ? ? ?SOCIAL HISTORY ?Social History  ? ?Tobacco Use  ? Smoking status: Some Days  ?  Packs/day: 0.25  ?  Years: 50.00  ?  Pack years: 12.50  ?  Types: Cigarettes  ? Smokeless tobacco: Never  ? Tobacco comments:  ?  PT CUT DOWN FROM 1 1/2 PPD TO 6 CIG DAILY  ?Vaping Use  ? Vaping Use: Never used  ?Substance Use Topics  ? Alcohol use: No  ? Drug use: No  ? ?  ? ?  ? ?OPHTHALMIC EXAM: ? ?Base Eye Exam   ? ? Visual  Acuity (ETDRS)   ? ?   Right Left  ? Dist [place] 20/30 -1+2 NLP  ? Dist ph [place] 20/20 -1   ? ?  ?  ? ? Tonometry (Tonopen, 7:52 AM)   ? ?   Right Left  ? Pressure 18   ? ?  ?  ? ? Pupils   ? ?   Dark Light Shape React APD  ? Right 5 4 Round Brisk None  ? Left No view      ? ?  ?  ? ? Visual Fields (Counting fingers)   ? ?   Left Right  ?   Full  ? Restrictions Total superior temporal, inferior temporal, superior nasal, inferior nasal deficiencies   ? ?  ?  ? ? Extraocular Movement   ? ?  Right Left  ?  FTFC No view  ?  -- -- --  ?--  --  ?-- -- --  ? -- -- --  ?--  --  ?-- -- --  ?  ? ?  ?  ? ? Neuro/Psych   ? ? Oriented x3: Yes  ? Mood/Affect: Normal  ? ?  ?  ? ? Dilation   ? ? Right eye: 1.0% Mydriacyl, 2.5% Phenylephrine @ 7:51 AM  ? ?  ?  ? ?  ? ?Slit Lamp and Fundus Exam   ? ? External Exam   ? ?   Right Left  ? External Normal Enophthalmos  ? ?  ?  ? ? Slit Lamp Exam   ? ?   Right Left  ? Lids/Lashes Normal Normal  ? Conjunctiva/Sclera White and quiet 1+ Injection  ? Cornea Clear Opacity: Central, Peripheral, with corneal neovascularization inferiorly.  Some irregular epithelial changes.  Opaque completely, crenated cornea with involutional change and folding from phthisis, with calcific band keratopathy forming  ? Anterior Chamber Deep and quiet No view  ? Iris Round and reactive No view  ? Lens 2+ Nuclear sclerosis No view  ? Anterior Vitreous Normal  no view  ? ?  ?  ? ? Fundus Exam   ? ?   Right Left  ? Posterior Vitreous Posterior vitreous detachment   ? Disc Normal No view posteriorly  ? C/D Ratio 0.4   ? Macula Normal   ? Vessels Normal   ? Periphery Normal   ? ?  ?  ? ?  ? ? ?IMAGING AND PROCEDURES  ?Imaging and Procedures for 03/14/22 ? ?Color Fundus Photography Optos - OU - Both Eyes   ? ?   ?Right Eye ?Progression has been stable. Disc findings include normal observations. Macula : normal observations. Vessels : normal observations. Periphery : normal observations.  ? ?Notes ?No views OS ? ? ? ?  ? ? ?   ?  ? ?  ?ASSESSMENT/PLAN: ? ?Nuclear sclerotic cataract of right eye ?Cataract progression OU explains the patient's induced myopia allowing him to read without glasses. ? ?I explained to him that his occasional monocu

## 2022-03-16 DIAGNOSIS — H52221 Regular astigmatism, right eye: Secondary | ICD-10-CM | POA: Diagnosis not present

## 2022-04-19 ENCOUNTER — Ambulatory Visit: Payer: Medicare Other | Admitting: Pulmonary Disease

## 2022-04-19 ENCOUNTER — Encounter: Payer: Self-pay | Admitting: Pulmonary Disease

## 2022-04-19 VITALS — BP 118/74 | HR 74 | Ht 67.0 in | Wt 163.2 lb

## 2022-04-19 DIAGNOSIS — R911 Solitary pulmonary nodule: Secondary | ICD-10-CM

## 2022-04-19 DIAGNOSIS — J432 Centrilobular emphysema: Secondary | ICD-10-CM

## 2022-04-19 DIAGNOSIS — F17210 Nicotine dependence, cigarettes, uncomplicated: Secondary | ICD-10-CM | POA: Diagnosis not present

## 2022-04-19 MED ORDER — BREZTRI AEROSPHERE 160-9-4.8 MCG/ACT IN AERO: 2.0000 | INHALATION_SPRAY | Freq: Two times a day (BID) | RESPIRATORY_TRACT | 0 refills | Status: DC

## 2022-04-19 NOTE — Patient Instructions (Addendum)
Try breztri inhaler 2 puffs twice daily - rinse mouth out after each use  Stop symbicort inhaler while on breztri  Continue albuterol inhaler as needed  Recommend that you work on quitting smoking again as this will help with your shortness of breath  Follow up in 3 months

## 2022-04-19 NOTE — Progress Notes (Signed)
Synopsis: Referred in December 2022 for COPD by Brent Arabian, MD  Subjective:   PATIENT ID: Brent Phelps GENDER: male DOB: 1946-04-14, MRN: [id number]  HPI  Chief Complaint  Patient presents with   Follow-up    3 mo f/u for emphysema. States he has been doing stable since last visit.    Brent Phelps is a a 76 year old male, daily smoker with history of prostate cancer who returns to pulmonary clinic for follow up of COPD and lung nodule.   He underwent navigational bronchoscopy on 02/06/22 with biopsies showing necrotic tissue without evidence of malignancy.   He quit smoking for over a month and is back to smoking half a pack per day. He reports increased dyspnea of recent.   He is using symbicort 160-4.4mg 2 puffs twice daily ad as needed albuterol 1-2 times per day  OV 01/10/22 He reports no changes in his breathing since last visit.  He continues to have exertional dyspnea.  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  He is using Symbicort 2 puffs twice daily and as needed albuterol inhaler and nebulizer treatments.  We reviewed CT chest scan in clinic today where the right upper lobe nodule/cavitary lesion remains stable over the past 3 months.  Discussed moving forward with a CT PET scan versus navigational bronchoscopy.  He wishes to proceed with biopsy at this time.  He quit smoking cigarettes 3 weeks ago.  OV 11/29/21 HRCT Chest 10/21/21 shows moderate centrilobular and paraseptal emphysema with diffuse bronchial wall thickening. There is a 1.1 x 0.9cm thick walled cavitary lesion of the right apex.   He continues on symbicort 160-4.552m 2 puffs twice daily. Using the nebulizer as needed with some relief along with albuterol inhaler.   He is scheduled for PFTs next month. He had overnight oximitry done last month. He continues to smoke intermittently.  OV 10/04/21 Patient has been smoking since age of [age]and is currently smoking about 5 cigarettes/day.  He was previously smoking just under a pack per day until couple years ago.  He has been having progressive shortness of breath since 2018.  He was started on Symbicort inhaler in 2018 with improvement of his breathing symptoms but he has continued to have dyspnea with exertion since.  He was trialed on Trelegy Ellipta and did not notice much difference and remains back on Symbicort.  He is using as needed albuterol inhaler.  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  He continues to work occasionally from his shop at home.  He does report sinus congestion.  He denies any cough or sputum production.  He does report intermittent wheezing.  He had to quit golfing last year due to shortness of breath.  He denies any issues with sleeping at night.  No nighttime awakenings with shortness of breath.  He feels rested in the morning time.  Records reviewed from Dr. EhMarisue Humble  Past Medical History:  Diagnosis Date   COPD (chronic obstructive pulmonary disease) ([facility]   COVID 01/20/2022   pt states no symptoms   Elevated prostate specific antigen (PSA)    Erectile dysfunction    Headache    Prostate cancer (HCC)    Shortness of breath on  exertion      Family History  Problem Relation Age of Onset   Cancer Brother        prostate   Cancer Sister        breast   Cancer Brother        colon     Social History   Socioeconomic History   Marital status: Widowed    Spouse name: Not on file   Number of children: Not on file   Years of education: Not on file   Highest education level: Not on file  Occupational History   Not on file  Tobacco Use   Smoking status: Some Days    Packs/day: 0.25    Years: 50.00    Total pack years: 12.50    Types: Cigarettes    Smokeless tobacco: Never   Tobacco comments:    PT CUT DOWN FROM 1 1/2 PPD TO 6 CIG DAILY  Vaping Use   Vaping Use: Never used  Substance and Sexual Activity   Alcohol use: No   Drug use: No   Sexual activity: Not on file  Other Topics Concern   Not on file  Social History Narrative   Not on file   Social Determinants of Health   Financial Resource Strain: Not on file  Food Insecurity: Not on file  Transportation Needs: Not on file  Physical Activity: Not on file  Stress: Not on file  Social Connections: Not on file  Intimate Partner Violence: Not on file     Allergies  Allergen Reactions   Codeine Nausea And Vomiting    Can take for a short time      Outpatient Medications Prior to Visit  Medication Sig Dispense Refill   albuterol (PROVENTIL HFA;VENTOLIN HFA) 108 (90 BASE) MCG/ACT inhaler Inhale 2 puffs into the lungs every 6 (six) hours as needed for wheezing.     albuterol (PROVENTIL) (2.5 MG/3ML) 0.083% nebulizer solution Take 3 mLs (2.5 mg total) by nebulization every 6 (six) hours as needed for wheezing or shortness of breath. 120 mL 12   budesonide-formoterol (SYMBICORT) 160-4.5 MCG/ACT inhaler Inhale 2 puffs into the lungs 2 (two) times daily.     No facility-administered medications prior to visit.   Review of Systems  Constitutional:  Negative for chills, fever, malaise/fatigue and weight loss.  HENT:  Negative for congestion, sinus pain and sore throat.   Eyes: Negative.   Respiratory:  Positive for shortness of breath. Negative for cough, hemoptysis, sputum production and wheezing.   Cardiovascular:  Negative for chest pain, palpitations, orthopnea, claudication and leg swelling.  Gastrointestinal:  Negative for abdominal pain, heartburn, nausea and vomiting.  Genitourinary: Negative.   Musculoskeletal:  Negative for joint pain and myalgias.  Skin:  Negative for rash.  Neurological:  Negative for weakness.  Endo/Heme/Allergies: Negative.    Psychiatric/Behavioral: Negative.      Objective:   Vitals:   04/19/22 0857  BP: 118/74  Pulse: 74  SpO2: 96%  Weight: 163 lb 3.2 oz (74 kg)  Height: '5\' 7"'$  (1.702 m)   Physical Exam Constitutional:      General: He is not in acute distress. HENT:     Head: Normocephalic and atraumatic.  Cardiovascular:     Rate and Rhythm: Normal rate and regular rhythm.     Pulses: Normal pulses.     Heart sounds: Normal heart sounds. No murmur heard. Pulmonary:     Effort: Pulmonary effort is normal.     Breath sounds:  Decreased air movement present. Decreased breath sounds present. No wheezing, rhonchi or rales.  Musculoskeletal:     Right lower leg: No edema.     Left lower leg: No edema.  Skin:    General: Skin is warm and dry.  Neurological:     General: No focal deficit present.     Mental Status: He is alert.  Psychiatric:        Mood and Affect: Mood normal.        Behavior: Behavior normal.        Thought Content: Thought content normal.        Judgment: Judgment normal.    CBC    Component Value Date/Time   WBC 4.2 02/06/2022 0858   RBC 4.57 02/06/2022 0858   HGB 14.7 02/06/2022 0858   HCT 43.9 02/06/2022 0858   PLT 167 02/06/2022 0858   MCV 96.1 02/06/2022 0858   MCH 32.2 02/06/2022 0858   MCHC 33.5 02/06/2022 0858   RDW 12.9 02/06/2022 0858      Latest Ref Rng & Units 02/06/2022    8:58 AM 07/11/2013    4:30 AM 07/10/2013    9:37 PM  BMP  Glucose 70 - 99 mg/dL 92  110  150   BUN 8 - 23 mg/dL 14  40  46   Creatinine 0.61 - 1.24 mg/dL 0.96  0.70  0.73   Sodium 135 - 145 mmol/L 140  137  135   Potassium 3.5 - 5.1 mmol/L 4.5  3.8  3.8   Chloride 98 - 111 mmol/L 108  105  103   CO2 22 - 32 mmol/L '26  25  23   '$ Calcium 8.9 - 10.3 mg/dL 9.1  8.1  9.0    Chest imaging: CT Chest 01/06/22 1. Persistent finding of a spiculated irregular nodule in the right upper lung measuring up to 1.2 cm diameter. Cavitation is present but less prominent than on the prior  study. Consider PET-CT versus tissue sampling for further evaluation. 2. Emphysematous changes in the lungs. 3. Aortic atherosclerosis.  HRCT Chest 10/21/21 1. No evidence of fibrotic interstitial lung disease. 2. Emphysema and diffuse bilateral bronchial wall thickening. 3. There is a small thick-walled cavitary lesion of the right pulmonary apex measuring 1.1 x 0.9 cm. This is nonspecific and may be infectious or inflammatory although suspicious for malignancy. Consider one of the following in 3 months for both low-risk and high-risk individuals: (a) repeat chest CT, (b) follow-up PET-CT, or (c) tissue sampling.   CT Chest 05/12/13 There is no pleural effusion.  There is moderate  centrilobular emphysema.  3 mm calcified granulomas identified in  the right upper lobe.  No airspace consolidation identified.  PFT:    Latest Ref Rng & Units 12/12/2021   10:47 AM  PFT Results  FVC-Pre L 1.94   FVC-Predicted Pre % 55   FVC-Post L 2.27   FVC-Predicted Post % 64   Pre FEV1/FVC % % 40   Post FEV1/FCV % % 43   FEV1-Pre L 0.77   FEV1-Predicted Pre % 31   FEV1-Post L 0.98   DLCO uncorrected ml/min/mmHg 15.53   DLCO UNC% % 70   DLCO corrected ml/min/mmHg 15.53   DLCO COR %Predicted % 70   DLVA Predicted % 82   PFTs: Mixed obstructive and restrictive defects with mild diffusion defect  Labs:  Path:  Echo:  Heart Catheterization:  Assessment & Plan:   Centrilobular emphysema (HCC)  Pulmonary nodule 1 cm  or greater in diameter - Plan: CT Chest Wo Contrast  Cigarette smoker  Discussion: Bralon Antkowiak is a a 76 year old male, daily smoker with history of prostate cancer who returns to pulmonary clinic for follow up of COPD and pulmonary nodule.   Patient has history of centrilobular emphysema as noted on CT chest scan from 2014.  He has mixed obstructive and restrictive defects on recent PFTs along with mild diffusion defect.   He is to try breztri inhaler 2 puffs twice  daily and hold the symbicort while trying the new inhaler. He can continue albuterol inhaler or nebulizer treatment as needed.  We discussed smoking cessation again. He wishes to quit on his own without nicotine replacement as he has done this in the past.   We will check a CT scan in 3 months to monitor the RUL nodule.   He refused ONO in the past  Follow-up in 3 months.  Freda Jackson, MD [facility] Office: [phone]   Current Outpatient Medications:    albuterol (PROVENTIL HFA;VENTOLIN HFA) 108 (90 BASE) MCG/ACT inhaler, Inhale 2 puffs into the lungs every 6 (six) hours as needed for wheezing., Disp: , Rfl:    albuterol (PROVENTIL) (2.5 MG/3ML) 0.083% nebulizer solution, Take 3 mLs (2.5 mg total) by nebulization every 6 (six) hours as needed for wheezing or shortness of breath., Disp: 120 mL, Rfl: 12   budesonide-formoterol (SYMBICORT) 160-4.5 MCG/ACT inhaler, Inhale 2 puffs into the lungs 2 (two) times daily., Disp: , Rfl:

## 2022-04-19 NOTE — Addendum Note (Signed)
Addended by: Valerie Salts on: 04/19/2022 10:30 AM   Modules accepted: Orders

## 2022-05-09 IMAGING — DX DG CHEST 1V PORT
1 series · 1 of 1 positions shown · non-contrast
Comparison: 05/21/2013

CLINICAL DATA: Bronchoscopy and biopsy on the right.

EXAM:
PORTABLE CHEST 1 VIEW

[chest]
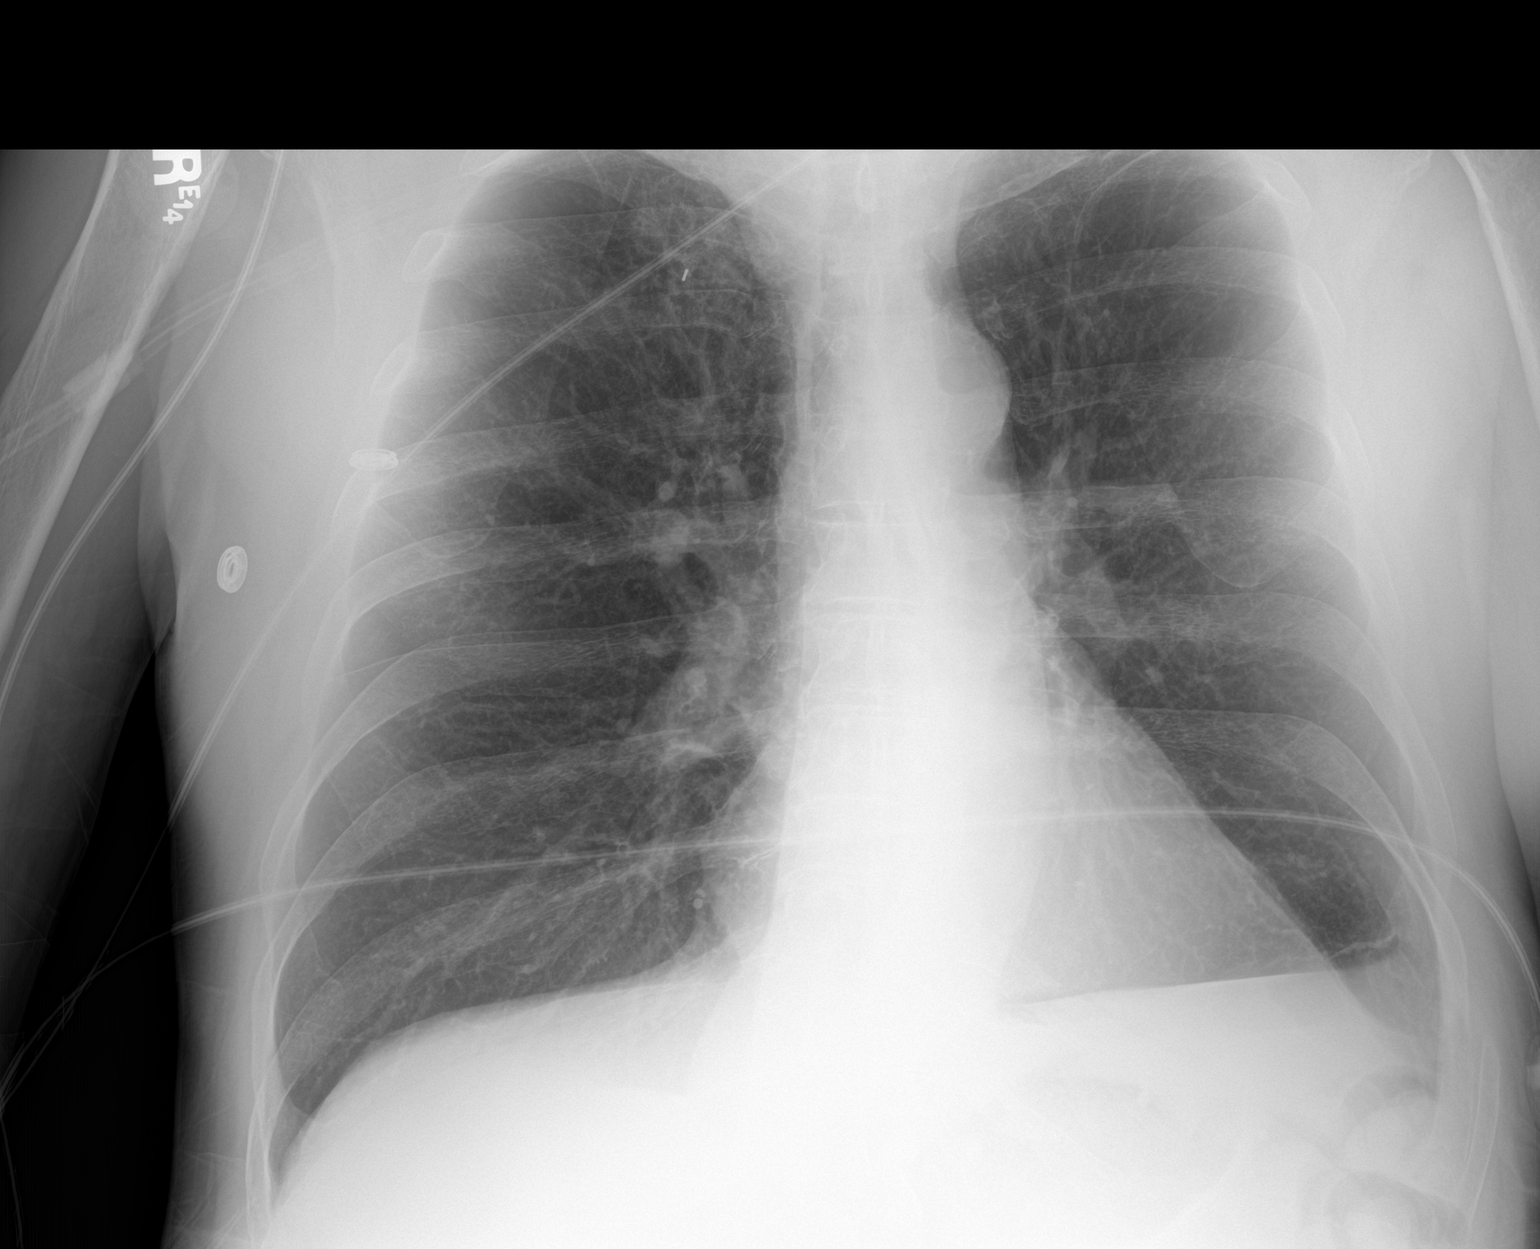

[1 of 1 positions shown; findings below may reference images not displayed]

FINDINGS: Heart and mediastinal shadows remain normal. Left lung is clear
except for mild chronic scarring at the left base. Right lung
appears clear. Clip projects in the right suprahilar region. No
pneumothorax or hemothorax.
IMPRESSION: Clip in the right suprahilar region. No pneumothorax, hemothorax or
significant pulmonary hemorrhage seen by radiography.

## 2022-05-23 DIAGNOSIS — J449 Chronic obstructive pulmonary disease, unspecified: Secondary | ICD-10-CM | POA: Diagnosis not present

## 2022-07-17 ENCOUNTER — Ambulatory Visit
Admission: RE | Admit: 2022-07-17 | Discharge: 2022-07-17 | Disposition: A | Discharge status: Home / Self Care | Payer: Medicare Other | Source: Ambulatory Visit | Attending: Pulmonary Disease | Admitting: Pulmonary Disease

## 2022-07-17 DIAGNOSIS — R911 Solitary pulmonary nodule: Secondary | ICD-10-CM

## 2022-07-17 DIAGNOSIS — J439 Emphysema, unspecified: Secondary | ICD-10-CM | POA: Diagnosis not present

## 2022-07-17 DIAGNOSIS — I700 Atherosclerosis of aorta: Secondary | ICD-10-CM | POA: Diagnosis not present

## 2022-07-24 ENCOUNTER — Ambulatory Visit: Payer: Medicare Other | Admitting: Internal Medicine

## 2022-07-25 ENCOUNTER — Ambulatory Visit: Payer: Medicare Other | Admitting: Pulmonary Disease

## 2022-07-25 ENCOUNTER — Encounter: Payer: Self-pay | Admitting: Pulmonary Disease

## 2022-07-25 VITALS — BP 122/84 | HR 77 | Ht 67.0 in | Wt 161.0 lb

## 2022-07-25 DIAGNOSIS — R911 Solitary pulmonary nodule: Secondary | ICD-10-CM | POA: Diagnosis not present

## 2022-07-25 DIAGNOSIS — F17210 Nicotine dependence, cigarettes, uncomplicated: Secondary | ICD-10-CM

## 2022-07-25 DIAGNOSIS — J432 Centrilobular emphysema: Secondary | ICD-10-CM

## 2022-07-25 MED ORDER — ALBUTEROL SULFATE HFA 108 (90 BASE) MCG/ACT IN AERS: 2.0000 | INHALATION_SPRAY | Freq: Four times a day (QID) | RESPIRATORY_TRACT | 11 refills | Status: DC | PRN

## 2022-07-25 NOTE — Progress Notes (Signed)
Synopsis: Referred in December 2022 for COPD by [name], MD  Subjective:   PATIENT ID: Brent Phelps GENDER: male DOB: Nov 11, 1945, MRN: [id number]  HPI  Chief Complaint  Patient presents with   Follow-up    F/U after CT scan.    Brent Phelps is a a 76 year old male, daily smoker with history of prostate cancer who returns to pulmonary clinic for follow up of COPD and lung nodule.   He reports no improvement in his breathing with trial of Breztri and has continued to use symbicort. He is using albuterol as needed. He is smoking on and off.  CT Chest 07/17/22 shows stable nodular denisty in the RUL.   OV 04/19/22 He underwent navigational bronchoscopy on 02/06/22 with biopsies showing necrotic tissue without evidence of malignancy.   He quit smoking for over a month and is back to smoking half a pack per day. He reports increased dyspnea of recent.   He is using symbicort 160-4.60mg 2 puffs twice daily ad as needed albuterol 1-2 times per day  OV 01/10/22 He reports no changes in his breathing since last visit.  He continues to have exertional dyspnea.  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  He is using Symbicort 2 puffs twice daily and as needed albuterol inhaler and nebulizer treatments.  We reviewed CT chest scan in clinic today where the right upper lobe nodule/cavitary lesion remains stable over the past 3 months.  Discussed moving forward with a CT PET scan versus navigational bronchoscopy.  He wishes to proceed with biopsy at this time.  He quit smoking cigarettes 3 weeks ago.  OV 11/29/21 HRCT Chest 10/21/21 shows moderate centrilobular and paraseptal emphysema with diffuse bronchial wall thickening. There is a 1.1 x 0.9cm thick walled cavitary lesion of the right apex.   He continues on symbicort 160-4.553m 2 puffs twice daily. Using the nebulizer as needed with some relief along with albuterol  inhaler.   He is scheduled for PFTs next month. He had overnight oximitry done last month. He continues to smoke intermittently.  OV 10/04/21 Patient has been smoking since age of [age]nd is currently smoking about 5 cigarettes/day.  He was previously smoking just under a pack per day until couple years ago.  He has been having progressive shortness of breath since 2018.  He was started on Symbicort inhaler in 2018 with improvement of his breathing symptoms but he has continued to have dyspnea with exertion since.  He was trialed on Trelegy Ellipta and did not notice much difference and remains back on Symbicort.  He is using as needed albuterol inhaler.  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  He continues to work occasionally from his shop at home.  He does report sinus congestion.  He denies any cough or sputum production.  He does report intermittent wheezing.  He had to quit golfing last year due to shortness of breath.  He denies any issues with sleeping at night.  No nighttime awakenings with shortness of breath.  He feels rested in the morning time.  Records reviewed from Dr. EhMarisue Humble  Past Medical History:  Diagnosis Date   [facility]   COVID 01/20/2022  pt states no symptoms   Elevated prostate specific antigen (PSA)    Erectile dysfunction    Headache    Prostate cancer (HCC)    Shortness of breath on exertion      Family History  Problem Relation Age of Onset   Cancer Brother        prostate   Cancer Sister        breast   Cancer Brother        colon     Social History   Socioeconomic History   Marital status: Widowed    Spouse name: Not on file   Number of children: Not on file   Years of education: Not on file   Highest education level:  Not on file  Occupational History   Not on file  Tobacco Use   Smoking status: Some Days    Packs/day: 0.25    Years: 50.00    Total pack years: 12.50    Types: Cigarettes   Smokeless tobacco: Never   Tobacco comments:    PT CUT DOWN FROM 1 1/2 PPD TO 6 CIG DAILY  Vaping Use   Vaping Use: Never used  Substance and Sexual Activity   Alcohol use: No   Drug use: No   Sexual activity: Not on file  Other Topics Concern   Not on file  Social History Narrative   Not on file   Social Determinants of Health   Financial Resource Strain: Not on file  Food Insecurity: Not on file  Transportation Needs: Not on file  Physical Activity: Not on file  Stress: Not on file  Social Connections: Not on file  Intimate Partner Violence: Not on file     Allergies  Allergen Reactions   Codeine Nausea And Vomiting    Can take for a short time      Outpatient Medications Prior to Visit  Medication Sig Dispense Refill   albuterol (PROVENTIL) (2.5 MG/3ML) 0.083% nebulizer solution Take 3 mLs (2.5 mg total) by nebulization every 6 (six) hours as needed for wheezing or shortness of breath. 120 mL 12   budesonide-formoterol (SYMBICORT) 160-4.5 MCG/ACT inhaler Inhale 2 puffs into the lungs 2 (two) times daily.     albuterol (PROVENTIL HFA;VENTOLIN HFA) 108 (90 BASE) MCG/ACT inhaler Inhale 2 puffs into the lungs every 6 (six) hours as needed for wheezing.     Budeson-Glycopyrrol-Formoterol (BREZTRI AEROSPHERE) 160-9-4.8 MCG/ACT AERO Inhale 2 puffs into the lungs in the morning and at bedtime. 11.8 g 0   No facility-administered medications prior to visit.   Review of Systems  Constitutional:  Negative for chills, fever, malaise/fatigue and weight loss.  HENT:  Negative for congestion, sinus pain and sore throat.   Eyes: Negative.   Respiratory:  Positive for shortness of breath. Negative for cough, hemoptysis, sputum production and wheezing.   Cardiovascular:  Negative for chest pain,  palpitations, orthopnea, claudication and leg swelling.  Gastrointestinal:  Negative for abdominal pain, heartburn, nausea and vomiting.  Genitourinary: Negative.   Musculoskeletal:  Negative for joint pain and myalgias.  Skin:  Negative for rash.  Neurological:  Negative for weakness.  Endo/Heme/Allergies: Negative.   Psychiatric/Behavioral: Negative.      Objective:   Vitals:   07/25/22 1557  BP: 122/84  Pulse: 77  SpO2: 95%  Weight: 161 lb (73 kg)  Height: '5\' 7"'$  (1.702 m)   Physical Exam Constitutional:      General: He is not in acute distress. HENT:  Head: Normocephalic and atraumatic.  Cardiovascular:     Rate and Rhythm: Normal rate and regular rhythm.     Pulses: Normal pulses.     Heart sounds: Normal heart sounds. No murmur heard. Pulmonary:     Effort: Pulmonary effort is normal.     Breath sounds: Decreased air movement present. Decreased breath sounds present. No wheezing, rhonchi or rales.  Musculoskeletal:     Right lower leg: No edema.     Left lower leg: No edema.  Skin:    General: Skin is warm and dry.  Neurological:     General: No focal deficit present.     Mental Status: He is alert.  Psychiatric:        Mood and Affect: Mood normal.        Behavior: Behavior normal.        Thought Content: Thought content normal.        Judgment: Judgment normal.    CBC    Component Value Date/Time   WBC 4.2 02/06/2022 0858   RBC 4.57 02/06/2022 0858   HGB 14.7 02/06/2022 0858   HCT 43.9 02/06/2022 0858   PLT 167 02/06/2022 0858   MCV 96.1 02/06/2022 0858   MCH 32.2 02/06/2022 0858   MCHC 33.5 02/06/2022 0858   RDW 12.9 02/06/2022 0858      Latest Ref Rng & Units 02/06/2022    8:58 AM 07/11/2013    4:30 AM 07/10/2013    9:37 PM  BMP  Glucose 70 - 99 mg/dL 92  110  150   BUN 8 - 23 mg/dL 14  40  46   Creatinine 0.61 - 1.24 mg/dL 0.96  0.70  0.73   Sodium 135 - 145 mmol/L 140  137  135   Potassium 3.5 - 5.1 mmol/L 4.5  3.8  3.8   Chloride 98  - 111 mmol/L 108  105  103   CO2 22 - 32 mmol/L '26  25  23   '$ Calcium 8.9 - 10.3 mg/dL 9.1  8.1  9.0    Chest imaging: CT Chest 07/17/22 1. Persistent irregular shaped nodular density in the right upper lobe. Given the appearance on priors in the transition to predominantly solid appearance, this is favored to represent fluid within pre-existing areas of bronchiectasis. Strictly speaking, neoplasm is not entirely excluded, however, and accordingly continued attention on follow-up studies is recommended to ensure stability. Repeat noncontrast chest CT is suggested in 6 months. 2. Mild diffuse bronchial wall thickening with moderate centrilobular and paraseptal emphysema; imaging findings suggestive of underlying COPD. 3. Aortic atherosclerosis, in addition to left main and three-vessel coronary artery disease. Assessment for potential risk factor modification, dietary therapy or pharmacologic therapy may be warranted, if clinically indicated.  CT Chest 01/06/22 1. Persistent finding of a spiculated irregular nodule in the right upper lung measuring up to 1.2 cm diameter. Cavitation is present but less prominent than on the prior study. Consider PET-CT versus tissue sampling for further evaluation. 2. Emphysematous changes in the lungs. 3. Aortic atherosclerosis.  HRCT Chest 10/21/21 1. No evidence of fibrotic interstitial lung disease. 2. Emphysema and diffuse bilateral bronchial wall thickening. 3. There is a small thick-walled cavitary lesion of the right pulmonary apex measuring 1.1 x 0.9 cm. This is nonspecific and may be infectious or inflammatory although suspicious for malignancy. Consider one of the following in 3 months for both low-risk and high-risk individuals: (a) repeat chest CT, (b) follow-up PET-CT, or (c) tissue sampling.  CT Chest 05/12/13 There is no pleural effusion.  There is moderate  centrilobular emphysema.  3 mm calcified granulomas identified in  the  right upper lobe.  No airspace consolidation identified.  PFT:    Latest Ref Rng & Units 12/12/2021   10:47 AM  PFT Results  FVC-Pre L 1.94   FVC-Predicted Pre % 55   FVC-Post L 2.27   FVC-Predicted Post % 64   Pre FEV1/FVC % % 40   Post FEV1/FCV % % 43   FEV1-Pre L 0.77   FEV1-Predicted Pre % 31   FEV1-Post L 0.98   DLCO uncorrected ml/min/mmHg 15.53   DLCO UNC% % 70   DLCO corrected ml/min/mmHg 15.53   DLCO COR %Predicted % 70   DLVA Predicted % 82   PFTs: Mixed obstructive and restrictive defects with mild diffusion defect  Labs:  Path:  Echo:  Heart Catheterization:  Assessment & Plan:   Centrilobular emphysema (HCC) - Plan: albuterol (VENTOLIN HFA) 108 (90 Base) MCG/ACT inhaler  Pulmonary nodule 1 cm or greater in diameter - Plan: CT Chest Wo Contrast  Cigarette smoker  Discussion: Brent Phelps is a a 76 year old male, daily smoker with history of prostate cancer who returns to pulmonary clinic for follow up of COPD and pulmonary nodule.   Patient has history of centrilobular emphysema as noted on CT chest scan from 2014.  He has mixed obstructive and restrictive defects on PFTs along with mild diffusion defect.   He is to continue symbicort 160-4.23mg 2 puffs twice daily. He did not benefit from breztri inhaler. He can continue albuterol inhaler or nebulizer treatment as needed.  We will check a CT scan in 6 months to monitor the RUL nodule.   Follow-up in 6 months.  JFreda Jackson MD [facility]: [phone]  Current Outpatient Medications:    albuterol (PROVENTIL) (2.5 MG/3ML) 0.083% nebulizer solution, Take 3 mLs (2.5 mg total) by nebulization every 6 (six) hours as needed for wheezing or shortness of breath., Disp: 120 mL, Rfl: 12   budesonide-formoterol (SYMBICORT) 160-4.5 MCG/ACT inhaler, Inhale 2 puffs into the lungs 2 (two) times daily., Disp: , Rfl:    albuterol (VENTOLIN HFA) 108 (90 Base) MCG/ACT inhaler,  Inhale 2 puffs into the lungs every 6 (six) hours as needed for wheezing., Disp: 8 g, Rfl: 11

## 2022-07-25 NOTE — Patient Instructions (Addendum)
Continue symbicort 2 puffs twice daily - rinse mouth out after each use  Continue to use albuterol inhaler or nebulizer treatments as needed  Follow up CT Chest scan in 6 months  Follow up visit after CT chest scan in 6 months

## 2022-08-07 ENCOUNTER — Other Ambulatory Visit: Payer: Self-pay | Admitting: Pulmonary Disease

## 2022-09-28 DIAGNOSIS — F17200 Nicotine dependence, unspecified, uncomplicated: Secondary | ICD-10-CM | POA: Diagnosis not present

## 2022-09-28 DIAGNOSIS — J449 Chronic obstructive pulmonary disease, unspecified: Secondary | ICD-10-CM | POA: Diagnosis not present

## 2022-09-28 DIAGNOSIS — Z0000 Encounter for general adult medical examination without abnormal findings: Secondary | ICD-10-CM | POA: Diagnosis not present

## 2022-09-28 DIAGNOSIS — Z8546 Personal history of malignant neoplasm of prostate: Secondary | ICD-10-CM | POA: Diagnosis not present

## 2022-09-28 DIAGNOSIS — E7800 Pure hypercholesterolemia, unspecified: Secondary | ICD-10-CM | POA: Diagnosis not present

## 2023-01-09 ENCOUNTER — Ambulatory Visit
Admission: RE | Admit: 2023-01-09 | Discharge: 2023-01-09 | Disposition: A | Discharge status: Home / Self Care | Payer: Medicare Other | Source: Ambulatory Visit | Attending: Pulmonary Disease | Admitting: Pulmonary Disease

## 2023-01-09 DIAGNOSIS — J439 Emphysema, unspecified: Secondary | ICD-10-CM | POA: Diagnosis not present

## 2023-01-09 DIAGNOSIS — I700 Atherosclerosis of aorta: Secondary | ICD-10-CM | POA: Diagnosis not present

## 2023-01-09 DIAGNOSIS — R911 Solitary pulmonary nodule: Secondary | ICD-10-CM | POA: Diagnosis not present

## 2023-01-09 DIAGNOSIS — R918 Other nonspecific abnormal finding of lung field: Secondary | ICD-10-CM | POA: Diagnosis not present

## 2023-01-31 DIAGNOSIS — K080 Exfoliation of teeth due to systemic causes: Secondary | ICD-10-CM | POA: Diagnosis not present

## 2023-01-31 NOTE — Progress Notes (Signed)
Called and spoke with patient, advised of plan from Dr. Erin Fulling.  He verbalized understanding.  Scheduled patient to see Dr. Erin Fulling on Monday 4/29 at 8:45 am, advised to arrive by 8:30 am for check in.  He verbalized understanding.  Nothing further needed.

## 2023-02-08 DIAGNOSIS — K080 Exfoliation of teeth due to systemic causes: Secondary | ICD-10-CM | POA: Diagnosis not present

## 2023-02-20 DIAGNOSIS — K080 Exfoliation of teeth due to systemic causes: Secondary | ICD-10-CM | POA: Diagnosis not present

## 2023-02-26 ENCOUNTER — Encounter: Payer: Self-pay | Admitting: Pulmonary Disease

## 2023-02-26 ENCOUNTER — Ambulatory Visit: Payer: Medicare Other | Admitting: Pulmonary Disease

## 2023-02-26 VITALS — BP 122/70 | HR 67 | Ht 67.0 in | Wt 161.0 lb

## 2023-02-26 DIAGNOSIS — R911 Solitary pulmonary nodule: Secondary | ICD-10-CM | POA: Diagnosis not present

## 2023-02-26 DIAGNOSIS — J432 Centrilobular emphysema: Secondary | ICD-10-CM

## 2023-02-26 DIAGNOSIS — F17210 Nicotine dependence, cigarettes, uncomplicated: Secondary | ICD-10-CM

## 2023-02-26 MED ORDER — FLUTICASONE-SALMETEROL 230-21 MCG/ACT IN AERO: 2.0000 | INHALATION_SPRAY | Freq: Two times a day (BID) | RESPIRATORY_TRACT | 12 refills | Status: DC

## 2023-02-26 NOTE — Patient Instructions (Addendum)
Start advair HFA inhaler 230-4.15mcg 2 puffs twice daily - rinse mouth out after each use  Use albuterol inhaler as needed  Your CT Chest shows a stable right upper lung nodule. We will follow this nodule in 12 months with repeat CT chest.   Call 1-800-quit-NOW to get free nicotine replacement and counseling from the state of [place]    Follow up in 1 year after CT Chest scan

## 2023-02-26 NOTE — Progress Notes (Signed)
Synopsis: Referred in December 2022 for COPD by Blair Heys, MD  Subjective:   PATIENT ID: Brent Phelps GENDER: male DOB: 1946-05-09, MRN: [id number]  HPI  Chief Complaint  Patient presents with   Follow-up    F/U after CT scan. States his insurance no longer covers Symbicort and he was switched to Advair. Advair is not working for him.    Brent Phelps is a a 77 year old male, daily smoker with history of prostate cancer who returns to pulmonary clinic for follow up of COPD and lung nodule.   He was changed from symbicort 160-4.59mcg to advair HFA 115-36mcg after last visit due to insurance coverage. He is having more shortness of breath recently.   He is smoking 0 to 5-6 cigarettes daily.   OV 07/25/22 He reports no improvement in his breathing with trial of Breztri and has continued to use symbicort. He is using albuterol as needed. He is smoking on and off.  CT Chest 07/17/22 shows stable nodular denisty in the RUL.   OV 04/19/22 He underwent navigational bronchoscopy on 02/06/22 with biopsies showing necrotic tissue without evidence of malignancy.   He quit smoking for over a month and is back to smoking half a pack per day. He reports increased dyspnea of recent.   He is using symbicort 160-4.52mcg 2 puffs twice daily ad as needed albuterol 1-2 times per day  OV 01/10/22 He reports no changes in his breathing since last visit.  He continues to have exertional dyspnea.  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  He is using Symbicort 2 puffs twice daily and as needed albuterol inhaler and nebulizer treatments.  We reviewed CT chest scan in clinic today where the right upper lobe nodule/cavitary lesion remains stable over the past 3 months.  Discussed moving forward with a CT PET scan versus navigational bronchoscopy.  He wishes to proceed with biopsy at this time.  He quit smoking cigarettes 3 weeks ago.  OV  11/29/21 HRCT Chest 10/21/21 shows moderate centrilobular and paraseptal emphysema with diffuse bronchial wall thickening. There is a 1.1 x 0.9cm thick walled cavitary lesion of the right apex.   He continues on symbicort 160-4.14mcg 2 puffs twice daily. Using the nebulizer as needed with some relief along with albuterol inhaler.   He is scheduled for PFTs next month. He had overnight oximitry done last month. He continues to smoke intermittently.  OV 10/04/21 Patient has been smoking since age of 41 and is currently smoking about 5 cigarettes/day.  He was previously smoking just under a pack per day until couple years ago.  He has been having progressive shortness of breath since 2018.  He was started on Symbicort inhaler in 2018 with improvement of his breathing symptoms but he has continued to have dyspnea with exertion since.  He was trialed on Trelegy Ellipta and did not notice much difference and remains back on Symbicort.  He is using as needed albuterol inhaler.  He uses the inhaler mostly with exertional activities such as yard work and working in his [employer].  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  He continues to work occasionally from his shop at home.  He does report sinus congestion.  He denies any cough or sputum production.  He does report intermittent wheezing.  He had to quit golfing last year  due to shortness of breath.  He denies any issues with sleeping at night.  No nighttime awakenings with shortness of breath.  He feels rested in the morning time.  Records reviewed from Dr. Manus Gunning.   Past Medical History:  Diagnosis Date   COPD (chronic obstructive pulmonary disease) (HCC)    COVID 01/20/2022   pt states no symptoms   Elevated prostate specific antigen (PSA)    Erectile dysfunction    Headache    Prostate cancer (HCC)    Shortness of breath on exertion      Family History  Problem  Relation Age of Onset   Cancer Brother        prostate   Cancer Sister        breast   Cancer Brother        colon     Social History   Socioeconomic History   Marital status: Widowed    Spouse name: Not on file   Number of children: Not on file   Years of education: Not on file   Highest education level: Not on file  Occupational History   Not on file  Tobacco Use   Smoking status: Some Days    Packs/day: 0.25    Years: 50.00    Additional pack years: 0.00    Total pack years: 12.50    Types: Cigarettes   Smokeless tobacco: Never   Tobacco comments:    PT CUT DOWN FROM 1 1/2 PPD TO 6 CIG DAILY  Vaping Use   Vaping Use: Never used  Substance and Sexual Activity   Alcohol use: No   Drug use: No   Sexual activity: Not on file  Other Topics Concern   Not on file  Social History Narrative   Not on file   Social Determinants of Health   Financial Resource Strain: Not on file  Food Insecurity: Not on file  Transportation Needs: Not on file  Physical Activity: Not on file  Stress: Not on file  Social Connections: Not on file  Intimate Partner Violence: Not on file     Allergies  Allergen Reactions   Codeine Nausea And Vomiting    Can take for a short time      Outpatient Medications Prior to Visit  Medication Sig Dispense Refill   albuterol (PROVENTIL) (2.5 MG/3ML) 0.083% nebulizer solution USE 1 VIAL IN NEBULIZER EVERY 6 HOURS - as needed for wheezing or shortness of breath 3 mL 11   albuterol (VENTOLIN HFA) 108 (90 Base) MCG/ACT inhaler Inhale 2 puffs into the lungs every 6 (six) hours as needed for wheezing. 8 g 11   ADVAIR HFA 115-21 MCG/ACT inhaler Inhale 2 puffs into the lungs 2 (two) times daily.     budesonide-formoterol (SYMBICORT) 160-4.5 MCG/ACT inhaler Inhale 2 puffs into the lungs 2 (two) times daily.     No facility-administered medications prior to visit.   Review of Systems  Constitutional:  Negative for chills, fever, malaise/fatigue and  weight loss.  HENT:  Negative for congestion, sinus pain and sore throat.   Eyes: Negative.   Respiratory:  Positive for shortness of breath. Negative for cough, hemoptysis, sputum production and wheezing.   Cardiovascular:  Negative for chest pain, palpitations, orthopnea, claudication and leg swelling.  Gastrointestinal:  Negative for abdominal pain, heartburn, nausea and vomiting.  Genitourinary: Negative.   Skin:  Negative for rash.    Objective:   Vitals:   02/26/23 0844  BP: 122/70  Pulse: 67  SpO2: 96%  Weight: 161 lb (73 kg)  Height: 5\' 7"  (1.702 m)   Physical Exam Constitutional:      General: He is not in acute distress. HENT:     Head: Normocephalic and atraumatic.  Cardiovascular:     Rate and Rhythm: Normal rate and regular rhythm.     Pulses: Normal pulses.     Heart sounds: Normal heart sounds. No murmur heard. Pulmonary:     Effort: Pulmonary effort is normal.     Breath sounds: Decreased air movement present. Decreased breath sounds present. No wheezing, rhonchi or rales.  Musculoskeletal:     Right lower leg: No edema.     Left lower leg: No edema.  Skin:    General: Skin is warm and dry.  Neurological:     General: No focal deficit present.     Mental Status: He is alert.    CBC    Component Value Date/Time   WBC 4.2 02/06/2022 0858   RBC 4.57 02/06/2022 0858   HGB 14.7 02/06/2022 0858   HCT 43.9 02/06/2022 0858   PLT 167 02/06/2022 0858   MCV 96.1 02/06/2022 0858   MCH 32.2 02/06/2022 0858   MCHC 33.5 02/06/2022 0858   RDW 12.9 02/06/2022 0858      Latest Ref Rng & Units 02/06/2022    8:58 AM 07/11/2013    4:30 AM 07/10/2013    9:37 PM  BMP  Glucose 70 - 99 mg/dL 92  409  811   BUN 8 - 23 mg/dL 14  40  46   Creatinine 0.61 - 1.24 mg/dL 9.14  7.82  9.56   Sodium 135 - 145 mmol/L 140  137  135   Potassium 3.5 - 5.1 mmol/L 4.5  3.8  3.8   Chloride 98 - 111 mmol/L 108  105  103   CO2 22 - 32 mmol/L 26  25  23    Calcium 8.9 - 10.3 mg/dL  9.1  8.1  9.0    Chest imaging: CT Chest 01/09/23 1. Dominant solid apical segment right upper lobe nodule is stable from 07/17/2022 and appears more organized than on 10/21/2021, suggesting a postinfectious/postinflammatory etiology. Recommend continued attention on follow-up as malignancy cannot be definitively excluded. 2. Additional bilateral pulmonary nodules, stable from 10/21/2021. Recommend attention on follow-up as indolent adenocarcinomas cannot be excluded. 3. Aortic atherosclerosis (ICD10-I70.0). Coronary artery calcification. 4.  Emphysem  CT Chest 07/17/22 1. Persistent irregular shaped nodular density in the right upper lobe. Given the appearance on priors in the transition to predominantly solid appearance, this is favored to represent fluid within pre-existing areas of bronchiectasis. Strictly speaking, neoplasm is not entirely excluded, however, and accordingly continued attention on follow-up studies is recommended to ensure stability. Repeat noncontrast chest CT is suggested in 6 months. 2. Mild diffuse bronchial wall thickening with moderate centrilobular and paraseptal emphysema; imaging findings suggestive of underlying COPD. 3. Aortic atherosclerosis, in addition to left main and three-vessel coronary artery disease. Assessment for potential risk factor modification, dietary therapy or pharmacologic therapy may be warranted, if clinically indicated.  CT Chest 01/06/22 1. Persistent finding of a spiculated irregular nodule in the right upper lung measuring up to 1.2 cm diameter. Cavitation is present but less prominent than on the prior study. Consider PET-CT versus tissue sampling for further evaluation. 2. Emphysematous changes in the lungs. 3. Aortic atherosclerosis.  HRCT Chest 10/21/21 1. No evidence of fibrotic interstitial lung disease. 2. Emphysema and diffuse bilateral bronchial wall thickening.  3. There is a small thick-walled cavitary lesion  of the right pulmonary apex measuring 1.1 x 0.9 cm. This is nonspecific and may be infectious or inflammatory although suspicious for malignancy. Consider one of the following in 3 months for both low-risk and high-risk individuals: (a) repeat chest CT, (b) follow-up PET-CT, or (c) tissue sampling.   CT Chest 05/12/13 There is no pleural effusion.  There is moderate  centrilobular emphysema.  3 mm calcified granulomas identified in  the right upper lobe.  No airspace consolidation identified.  PFT:    Latest Ref Rng & Units 12/12/2021   10:47 AM  PFT Results  FVC-Pre L 1.94   FVC-Predicted Pre % 55   FVC-Post L 2.27   FVC-Predicted Post % 64   Pre FEV1/FVC % % 40   Post FEV1/FCV % % 43   FEV1-Pre L 0.77   FEV1-Predicted Pre % 31   FEV1-Post L 0.98   DLCO uncorrected ml/min/mmHg 15.53   DLCO UNC% % 70   DLCO corrected ml/min/mmHg 15.53   DLCO COR %Predicted % 70   DLVA Predicted % 82   PFTs: Mixed obstructive and restrictive defects with mild diffusion defect  Labs:  Path:  Echo:  Heart Catheterization:  Assessment & Plan:   Centrilobular emphysema (HCC) - Plan: fluticasone-salmeterol (ADVAIR HFA) 230-21 MCG/ACT inhaler  Pulmonary nodule 1 cm or greater in diameter  Cigarette smoker  Discussion: Brent Phelps is a a 77 year old male, daily smoker with history of prostate cancer who returns to pulmonary clinic for follow up of COPD and pulmonary nodule.   Patient has history of centrilobular emphysema as noted on CT chest scan from 2014.  He has mixed obstructive and restrictive defects on PFTs along with mild diffusion defect.   He is to use Advair HFA 230-62mcg 2 puffs twice daily. He did not benefit from breztri inhaler. He can continue albuterol inhaler or nebulizer treatment as needed.  The RUL nodule is stable in size. We will plan for follow up scan in 12 months.   Follow-up in 12 months after CT Chest scan.   Melody Comas, MD [facility] Office: [phone]   Current Outpatient Medications:    albuterol (PROVENTIL) (2.5 MG/3ML) 0.083% nebulizer solution, USE 1 VIAL IN NEBULIZER EVERY 6 HOURS - as needed for wheezing or shortness of breath, Disp: 3 mL, Rfl: 11   albuterol (VENTOLIN HFA) 108 (90 Base) MCG/ACT inhaler, Inhale 2 puffs into the lungs every 6 (six) hours as needed for wheezing., Disp: 8 g, Rfl: 11   fluticasone-salmeterol (ADVAIR HFA) 230-21 MCG/ACT inhaler, Inhale 2 puffs into the lungs 2 (two) times daily., Disp: 1 each, Rfl: 12

## 2023-03-15 ENCOUNTER — Encounter (INDEPENDENT_AMBULATORY_CARE_PROVIDER_SITE_OTHER): Payer: Medicare Other | Admitting: Ophthalmology

## 2023-04-25 ENCOUNTER — Other Ambulatory Visit: Payer: Self-pay | Admitting: Pulmonary Disease

## 2023-04-25 DIAGNOSIS — J432 Centrilobular emphysema: Secondary | ICD-10-CM

## 2023-04-30 DIAGNOSIS — J449 Chronic obstructive pulmonary disease, unspecified: Secondary | ICD-10-CM | POA: Diagnosis not present

## 2023-04-30 DIAGNOSIS — D485 Neoplasm of uncertain behavior of skin: Secondary | ICD-10-CM | POA: Diagnosis not present

## 2023-04-30 DIAGNOSIS — I700 Atherosclerosis of aorta: Secondary | ICD-10-CM | POA: Diagnosis not present

## 2023-04-30 DIAGNOSIS — R911 Solitary pulmonary nodule: Secondary | ICD-10-CM | POA: Diagnosis not present

## 2023-05-07 DIAGNOSIS — L578 Other skin changes due to chronic exposure to nonionizing radiation: Secondary | ICD-10-CM | POA: Diagnosis not present

## 2023-05-07 DIAGNOSIS — C44311 Basal cell carcinoma of skin of nose: Secondary | ICD-10-CM | POA: Diagnosis not present

## 2023-05-07 DIAGNOSIS — L821 Other seborrheic keratosis: Secondary | ICD-10-CM | POA: Diagnosis not present

## 2023-05-07 DIAGNOSIS — D1801 Hemangioma of skin and subcutaneous tissue: Secondary | ICD-10-CM | POA: Diagnosis not present

## 2023-05-15 DIAGNOSIS — C44311 Basal cell carcinoma of skin of nose: Secondary | ICD-10-CM | POA: Diagnosis not present

## 2023-06-01 DIAGNOSIS — D692 Other nonthrombocytopenic purpura: Secondary | ICD-10-CM | POA: Diagnosis not present

## 2023-10-01 DIAGNOSIS — Z8546 Personal history of malignant neoplasm of prostate: Secondary | ICD-10-CM | POA: Diagnosis not present

## 2023-10-01 DIAGNOSIS — E7800 Pure hypercholesterolemia, unspecified: Secondary | ICD-10-CM | POA: Diagnosis not present

## 2023-10-01 DIAGNOSIS — I700 Atherosclerosis of aorta: Secondary | ICD-10-CM | POA: Diagnosis not present

## 2023-10-02 DIAGNOSIS — Z23 Encounter for immunization: Secondary | ICD-10-CM | POA: Diagnosis not present

## 2023-10-02 DIAGNOSIS — E7800 Pure hypercholesterolemia, unspecified: Secondary | ICD-10-CM | POA: Diagnosis not present

## 2023-10-02 DIAGNOSIS — Z0000 Encounter for general adult medical examination without abnormal findings: Secondary | ICD-10-CM | POA: Diagnosis not present

## 2023-10-02 DIAGNOSIS — J449 Chronic obstructive pulmonary disease, unspecified: Secondary | ICD-10-CM | POA: Diagnosis not present

## 2023-10-02 DIAGNOSIS — I700 Atherosclerosis of aorta: Secondary | ICD-10-CM | POA: Diagnosis not present

## 2023-10-02 DIAGNOSIS — J439 Emphysema, unspecified: Secondary | ICD-10-CM | POA: Diagnosis not present

## 2023-11-27 DIAGNOSIS — H524 Presbyopia: Secondary | ICD-10-CM | POA: Diagnosis not present

## 2024-01-15 ENCOUNTER — Other Ambulatory Visit: Payer: Self-pay | Admitting: Pulmonary Disease

## 2024-01-15 DIAGNOSIS — J432 Centrilobular emphysema: Secondary | ICD-10-CM

## 2024-01-16 ENCOUNTER — Ambulatory Visit (HOSPITAL_COMMUNITY)
Admission: RE | Admit: 2024-01-16 | Discharge: 2024-01-16 | Disposition: A | Discharge status: Home / Self Care | Source: Ambulatory Visit | Attending: Pulmonary Disease | Admitting: Pulmonary Disease

## 2024-01-16 DIAGNOSIS — R918 Other nonspecific abnormal finding of lung field: Secondary | ICD-10-CM | POA: Diagnosis not present

## 2024-01-16 DIAGNOSIS — I7123 Aneurysm of the descending thoracic aorta, without rupture: Secondary | ICD-10-CM | POA: Diagnosis not present

## 2024-01-16 DIAGNOSIS — R911 Solitary pulmonary nodule: Secondary | ICD-10-CM | POA: Insufficient documentation

## 2024-01-16 DIAGNOSIS — J439 Emphysema, unspecified: Secondary | ICD-10-CM | POA: Diagnosis not present

## 2024-02-20 ENCOUNTER — Encounter: Payer: Self-pay | Admitting: Pulmonary Disease

## 2024-02-20 ENCOUNTER — Ambulatory Visit: Admitting: Pulmonary Disease

## 2024-02-20 VITALS — BP 135/84 | HR 74 | Ht 67.0 in | Wt 155.0 lb

## 2024-02-20 DIAGNOSIS — R918 Other nonspecific abnormal finding of lung field: Secondary | ICD-10-CM | POA: Diagnosis not present

## 2024-02-20 DIAGNOSIS — F17210 Nicotine dependence, cigarettes, uncomplicated: Secondary | ICD-10-CM | POA: Diagnosis not present

## 2024-02-20 DIAGNOSIS — J432 Centrilobular emphysema: Secondary | ICD-10-CM

## 2024-02-20 DIAGNOSIS — J189 Pneumonia, unspecified organism: Secondary | ICD-10-CM

## 2024-02-20 MED ORDER — AMOXICILLIN-POT CLAVULANATE 875-125 MG PO TABS: 1.0000 | ORAL_TABLET | Freq: Two times a day (BID) | ORAL | 0 refills | Status: DC

## 2024-02-20 MED ORDER — FLUTICASONE-SALMETEROL 230-21 MCG/ACT IN AERO: 2.0000 | INHALATION_SPRAY | Freq: Two times a day (BID) | RESPIRATORY_TRACT | 12 refills | Status: DC

## 2024-02-20 NOTE — Patient Instructions (Addendum)
 Continue advair inhaler 2 puffs twice daily - rinse mouth out after each use  Use albuterol  inhaler 1-2 puffs every 4-6 hours  You have a new left upper lobe lung nodule, we will repeat a scan in June  Follow up in 3 months

## 2024-02-20 NOTE — Progress Notes (Signed)
 Synopsis: Referred in December 2022 for COPD by Jannelle Memory, MD  Subjective:   PATIENT ID: Brent Phelps GENDER: male DOB: 1946-02-18, MRN: [id number]  HPI  Chief Complaint  Patient presents with   Follow-up   Brent Phelps is a a 78 year old male, daily smoker with history of prostate cancer who returns to pulmonary clinic for follow up of COPD and lung nodule.   No issues since last visit. Continues to smoke a few cigarettes per day. He is using advair 230-4.5mcg 2 puffs twice daily. Using albuterol  as needed.  Follow up nodule CT scan shows stable RUL nodule and shows new LUL nodule/mass concerning for inflammation vs infection given appearance with underlying emphysema.   OV 02/26/23 He was changed from symbicort 160-4.5mcg to advair HFA 115-21mcg after last visit due to insurance coverage. He is having more shortness of breath recently.   He is smoking 0 to 5-6 cigarettes daily.   OV 07/25/22 He reports no improvement in his breathing with trial of Breztri  and has continued to use symbicort. He is using albuterol  as needed. He is smoking on and off.  CT Chest 07/17/22 shows stable nodular denisty in the RUL.   OV 04/19/22 He underwent navigational bronchoscopy on 02/06/22 with biopsies showing necrotic tissue without evidence of malignancy.   He quit smoking for over a month and is back to smoking half a pack per day. He reports increased dyspnea of recent.   He is using symbicort 160-4.5mcg 2 puffs twice daily ad as needed albuterol  1-2 times per day  OV 01/10/22 He reports no changes in his breathing since last visit.  He continues to have exertional dyspnea.  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  He is using Symbicort 2 puffs twice daily and as needed albuterol  inhaler and nebulizer treatments.  We reviewed CT chest scan in clinic today where the right upper lobe nodule/cavitary lesion remains stable  over the past 3 months.  Discussed moving forward with a CT PET scan versus navigational bronchoscopy.  He wishes to proceed with biopsy at this time.  He quit smoking cigarettes 3 weeks ago.  OV 11/29/21 HRCT Chest 10/21/21 shows moderate centrilobular and paraseptal emphysema with diffuse bronchial wall thickening. There is a 1.1 x 0.9cm thick walled cavitary lesion of the right apex.   He continues on symbicort 160-4.5mcg 2 puffs twice daily. Using the nebulizer as needed with some relief along with albuterol  inhaler.   He is scheduled for PFTs next month. He had overnight oximitry done last month. He continues to smoke intermittently.  OV 10/04/21 Patient has been smoking since age of 33 and is currently smoking about 5 cigarettes/day.  He was previously smoking just under a pack per day until couple years ago.  He has been having progressive shortness of breath since 2018.  He was started on Symbicort inhaler in 2018 with improvement of his breathing symptoms but he has continued to have dyspnea with exertion since.  He was trialed on Trelegy Ellipta and did not notice much difference and remains back on Symbicort.  He is using as needed albuterol  inhaler.  He uses the inhaler mostly with exertional activities such as yard work and working in his [employer].  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  He continues to work occasionally from his shop at home.  He does report sinus congestion.  He denies any cough or sputum production.  He does report intermittent wheezing.  He had to quit golfing last year due to shortness of breath.  He denies any issues with sleeping at night.  No nighttime awakenings with shortness of breath.  He feels rested in the morning time.  Records reviewed from Dr. Euel Herring.   Past Medical History:  Diagnosis Date   COPD (chronic obstructive pulmonary disease) (HCC)    COVID 01/20/2022    pt states no symptoms   Elevated prostate specific antigen (PSA)    Erectile dysfunction    Headache    Prostate cancer (HCC)    Shortness of breath on exertion      Family History  Problem Relation Age of Onset   Cancer Brother        prostate   Cancer Sister        breast   Cancer Brother        colon     Social History   Socioeconomic History   Marital status: Widowed    Spouse name: Not on file   Number of children: Not on file   Years of education: Not on file   Highest education level: Not on file  Occupational History   Not on file  Tobacco Use   Smoking status: Some Days    Current packs/day: 0.25    Average packs/day: 0.3 packs/day for 50.0 years (12.5 ttl pk-yrs)    Types: Cigarettes   Smokeless tobacco: Never   Tobacco comments:    PT CUT DOWN FROM 1 1/2 PPD TO 6 CIG DAILY  Vaping Use   Vaping status: Never Used  Substance and Sexual Activity   Alcohol use: No   Drug use: No   Sexual activity: Not on file  Other Topics Concern   Not on file  Social History Narrative   Not on file   Social Drivers of Health   Financial Resource Strain: Not on file  Food Insecurity: Not on file  Transportation Needs: Not on file  Physical Activity: Not on file  Stress: Not on file  Social Connections: Not on file  Intimate Partner Violence: Not on file     Allergies  Allergen Reactions   Codeine Nausea And Vomiting    Can take for a short time      Outpatient Medications Prior to Visit  Medication Sig Dispense Refill   albuterol  (PROVENTIL ) (2.5 MG/3ML) 0.083% nebulizer solution USE 1 VIAL IN NEBULIZER EVERY 6 HOURS - as needed for wheezing or shortness of breath 3 mL 11   albuterol  (VENTOLIN  HFA) 108 (90 Base) MCG/ACT inhaler INHALE 2 PUFFS INTO THE LUNGS EVERY 6 HOURS AS NEEDED FOR WHEEZING 6.7 g 11   fluticasone -salmeterol (ADVAIR HFA) 230-21 MCG/ACT inhaler Inhale 2 puffs into the lungs 2 (two) times daily. 1 each 12   No facility-administered  medications prior to visit.   Review of Systems  Constitutional:  Negative for chills, fever, malaise/fatigue and weight loss.  HENT:  Negative for congestion, sinus pain and sore throat.   Eyes: Negative.   Respiratory:  Positive for shortness of breath. Negative for cough, hemoptysis, sputum production and wheezing.   Cardiovascular:  Negative for chest pain, palpitations, orthopnea, claudication and leg swelling.  Gastrointestinal:  Negative for abdominal pain, heartburn, nausea and vomiting.  Genitourinary: Negative.   Skin:  Negative for rash.    Objective:   Vitals:   02/20/24 1111  BP: 135/84  Pulse: 74  SpO2: 94%  Weight: 155 lb (70.3 kg)  Height: 5\' 7"  (1.702 m)    Physical Exam Constitutional:      General: He is not in acute distress. HENT:     Head: Normocephalic and atraumatic.  Cardiovascular:     Rate and Rhythm: Normal rate and regular rhythm.     Pulses: Normal pulses.     Heart sounds: Normal heart sounds. No murmur heard. Pulmonary:     Effort: Pulmonary effort is normal.     Breath sounds: Decreased air movement present. Decreased breath sounds present. No wheezing, rhonchi or rales.  Musculoskeletal:     Right lower leg: No edema.     Left lower leg: No edema.  Skin:    General: Skin is warm and dry.  Neurological:     General: No focal deficit present.     Mental Status: He is alert.    CBC    Component Value Date/Time   WBC 4.2 02/06/2022 0858   RBC 4.57 02/06/2022 0858   HGB 14.7 02/06/2022 0858   HCT 43.9 02/06/2022 0858   PLT 167 02/06/2022 0858   MCV 96.1 02/06/2022 0858   MCH 32.2 02/06/2022 0858   MCHC 33.5 02/06/2022 0858   RDW 12.9 02/06/2022 0858      Latest Ref Rng & Units 02/06/2022    8:58 AM 07/11/2013    4:30 AM 07/10/2013    9:37 PM  BMP  Glucose 70 - 99 mg/dL 92  657  846   BUN 8 - 23 mg/dL 14  40  46   Creatinine 0.61 - 1.24 mg/dL 9.62  9.52  8.41   Sodium 135 - 145 mmol/L 140  137  135   Potassium 3.5 - 5.1  mmol/L 4.5  3.8  3.8   Chloride 98 - 111 mmol/L 108  105  103   CO2 22 - 32 mmol/L 26  25  23    Calcium 8.9 - 10.3 mg/dL 9.1  8.1  9.0    Chest imaging: CT Chest 01/16/24 1. Interval development of a mixed solid and cystic mass within the left upper lobe measuring at least 1.5 x 3.2 cm, indeterminate. While this may represent a an inflammatory process, a primary pulmonary neoplasm is not excluded. PET CT examination is recommended further evaluation. 2. Stable 6 mm right upper lobe pulmonary nodule, previously biopsied with a biopsy marker in place. 3. Stable ground-glass right upper lobe pulmonary nodule measuring 12 x 13 mm, clearly progressed since remote prior examination of 01/06/2022. Given its relatively slow progression a lesion the spectrum is favored. Continued follow-up imaging is warranted. 4. Stable spiculated lesion left upper lobe within the perihilar region measuring 4 x 13 mm since remote prior examination of 01/06/2022.  CT Chest 01/09/23 1. Dominant solid apical segment right upper lobe nodule is stable from 07/17/2022 and appears more organized than on 10/21/2021, suggesting a postinfectious/postinflammatory etiology. Recommend continued attention on follow-up as malignancy cannot be definitively excluded. 2. Additional bilateral pulmonary nodules, stable from 10/21/2021. Recommend attention on follow-up as indolent adenocarcinomas cannot be excluded. 3. Aortic atherosclerosis (ICD10-I70.0). Coronary artery calcification. 4.  Emphysem  CT Chest 07/17/22 1. Persistent irregular shaped nodular density in the right upper lobe. Given the appearance on priors in the transition to predominantly solid appearance, this is favored to represent fluid within pre-existing areas of bronchiectasis. Strictly speaking, neoplasm is not entirely excluded, however, and accordingly continued attention on follow-up studies is recommended to ensure  stability. Repeat noncontrast  chest CT is suggested in 6 months. 2. Mild diffuse bronchial wall thickening with moderate centrilobular and paraseptal emphysema; imaging findings suggestive of underlying COPD. 3. Aortic atherosclerosis, in addition to left main and three-vessel coronary artery disease. Assessment for potential risk factor modification, dietary therapy or pharmacologic therapy may be warranted, if clinically indicated.  CT Chest 01/06/22 1. Persistent finding of a spiculated irregular nodule in the right upper lung measuring up to 1.2 cm diameter. Cavitation is present but less prominent than on the prior study. Consider PET-CT versus tissue sampling for further evaluation. 2. Emphysematous changes in the lungs. 3. Aortic atherosclerosis.  HRCT Chest 10/21/21 1. No evidence of fibrotic interstitial lung disease. 2. Emphysema and diffuse bilateral bronchial wall thickening. 3. There is a small thick-walled cavitary lesion of the right pulmonary apex measuring 1.1 x 0.9 cm. This is nonspecific and may be infectious or inflammatory although suspicious for malignancy. Consider one of the following in 3 months for both low-risk and high-risk individuals: (a) repeat chest CT, (b) follow-up PET-CT, or (c) tissue sampling.   CT Chest 05/12/13 There is no pleural effusion.  There is moderate  centrilobular emphysema.  3 mm calcified granulomas identified in  the right upper lobe.  No airspace consolidation identified.  PFT:    Latest Ref Rng & Units 12/12/2021   10:47 AM  PFT Results  FVC-Pre L 1.94   FVC-Predicted Pre % 55   FVC-Post L 2.27   FVC-Predicted Post % 64   Pre FEV1/FVC % % 40   Post FEV1/FCV % % 43   FEV1-Pre L 0.77   FEV1-Predicted Pre % 31   FEV1-Post L 0.98   DLCO uncorrected ml/min/mmHg 15.53   DLCO UNC% % 70   DLCO corrected ml/min/mmHg 15.53   DLCO COR %Predicted % 70   DLVA Predicted % 82   PFTs: Mixed obstructive and restrictive defects with mild diffusion  defect  Labs:  Path:  Echo:  Heart Catheterization:  Assessment & Plan:   Centrilobular emphysema (HCC) - Plan: fluticasone -salmeterol (ADVAIR HFA) 230-21 MCG/ACT inhaler  Cigarette smoker  Pulmonary nodules - Plan: CT Chest Wo Contrast  Pneumonia of left lung due to infectious organism, unspecified part of lung - Plan: amoxicillin -clavulanate (AUGMENTIN ) 875-125 MG tablet  Discussion: Brent Phelps is a a 78 year old male, daily smoker with history of prostate cancer who returns to pulmonary clinic for follow up of COPD and pulmonary nodule.   Patient has history of centrilobular emphysema as noted on CT chest scan from 2014.  He has mixed obstructive and restrictive defects on PFTs along with mild diffusion defect.   He is to continue Advair HFA 230-21mcg 2 puffs twice daily. He did not benefit from breztri  inhaler. He can continue albuterol  inhaler or nebulizer treatment as needed.  The RUL nodule is stable in size. He has new LUL mass/nodule which appears to be infection/inflammation with underlying emphysema, but given his risk factors will order follow up scan in June, 3 months from his most recent scan. Treat with 7 day course of augmentin .  Follow-up in 3 months after CT Chest scan.   Duaine German, MD [facility] Office: [phone]   Current Outpatient Medications:    albuterol  (PROVENTIL ) (2.5 MG/3ML) 0.083% nebulizer solution, USE 1 VIAL IN NEBULIZER EVERY 6 HOURS - as needed for wheezing or shortness of breath, Disp: 3 mL, Rfl: 11   albuterol  (VENTOLIN  HFA) 108 (90 Base) MCG/ACT inhaler, INHALE 2  PUFFS INTO THE LUNGS EVERY 6 HOURS AS NEEDED FOR WHEEZING, Disp: 6.7 g, Rfl: 11   amoxicillin -clavulanate (AUGMENTIN ) 875-125 MG tablet, Take 1 tablet by mouth 2 (two) times daily., Disp: 14 tablet, Rfl: 0   fluticasone -salmeterol (ADVAIR HFA) 230-21 MCG/ACT inhaler, Inhale 2 puffs into the lungs 2 (two) times daily., Disp: 1 each, Rfl: 12

## 2024-03-08 ENCOUNTER — Other Ambulatory Visit: Payer: Self-pay | Admitting: Pulmonary Disease

## 2024-03-08 DIAGNOSIS — J432 Centrilobular emphysema: Secondary | ICD-10-CM

## 2024-03-25 DIAGNOSIS — J449 Chronic obstructive pulmonary disease, unspecified: Secondary | ICD-10-CM | POA: Diagnosis not present

## 2024-03-25 DIAGNOSIS — J439 Emphysema, unspecified: Secondary | ICD-10-CM | POA: Diagnosis not present

## 2024-03-29 DIAGNOSIS — J439 Emphysema, unspecified: Secondary | ICD-10-CM | POA: Diagnosis not present

## 2024-03-29 DIAGNOSIS — E7800 Pure hypercholesterolemia, unspecified: Secondary | ICD-10-CM | POA: Diagnosis not present

## 2024-03-29 DIAGNOSIS — J449 Chronic obstructive pulmonary disease, unspecified: Secondary | ICD-10-CM | POA: Diagnosis not present

## 2024-03-29 DIAGNOSIS — Z8546 Personal history of malignant neoplasm of prostate: Secondary | ICD-10-CM | POA: Diagnosis not present

## 2024-04-08 ENCOUNTER — Ambulatory Visit (HOSPITAL_COMMUNITY)
Admission: RE | Admit: 2024-04-08 | Discharge: 2024-04-08 | Disposition: A | Discharge status: Home / Self Care | Source: Ambulatory Visit | Attending: Pulmonary Disease | Admitting: Pulmonary Disease

## 2024-04-08 DIAGNOSIS — R918 Other nonspecific abnormal finding of lung field: Secondary | ICD-10-CM | POA: Insufficient documentation

## 2024-04-08 DIAGNOSIS — J432 Centrilobular emphysema: Secondary | ICD-10-CM | POA: Diagnosis not present

## 2024-04-08 DIAGNOSIS — R911 Solitary pulmonary nodule: Secondary | ICD-10-CM | POA: Diagnosis not present

## 2024-04-08 DIAGNOSIS — I700 Atherosclerosis of aorta: Secondary | ICD-10-CM | POA: Diagnosis not present

## 2024-04-16 ENCOUNTER — Telehealth: Payer: Self-pay | Admitting: Pulmonary Disease

## 2024-04-16 DIAGNOSIS — R918 Other nonspecific abnormal finding of lung field: Secondary | ICD-10-CM | POA: Insufficient documentation

## 2024-04-16 NOTE — Telephone Encounter (Signed)
 Please schedule the following:  Provider performing procedure:Dr. Baldwin Levee Diagnosis: Left Lung Mass Which side if for nodule / mass? Left Procedure: Navigational Bronchosocpy with transbronchial biopsies and BAL  Has patient been spoken to by Provider and given informed consent? Yes Anesthesia: General Do you need Fluro? Yes Duration of procedure: 60-90 minutes Date: As soon as possible Alternate Date: as soon as possible  Time: no preference Location: [facility] Does patient have OSA? No DM? No Or Latex allergy? No Medication Restriction/ Anticoagulate/Antiplatelet: No Pre-op Labs Ordered:determined by Anesthesia Imaging request: N/a  (If, SuperDimension CT Chest, please have STAT courier sent to [facility])  Thanks, Duaine German, MD [facility] Office: [phone]

## 2024-04-16 NOTE — Telephone Encounter (Signed)
 I Spoke with patient about the results, he is amenable to doing bronchoscopy with biopsies.   Will arrange for this to be done as soon as possible.  Duaine German, MD [facility] Office: [phone]

## 2024-04-16 NOTE — Telephone Encounter (Signed)
 Call report received from [facility], spoke with MG  IMPRESSION: 1. Significant enlargement of the irregular lesion in the left upper lobe. The lesion now extends into the left lower lobe. Findings are most suggestive for an infectious etiology but an aggressive neoplasm cannot be completely excluded.  Dr. Diania Fortes, please advise.  Thank you.

## 2024-04-16 NOTE — Telephone Encounter (Signed)
 Call Report of CT of the Chest Impression number 1

## 2024-04-17 ENCOUNTER — Encounter: Payer: Self-pay | Admitting: Pulmonary Disease

## 2024-04-17 NOTE — Telephone Encounter (Signed)
 Patient is aware of their bronch appointment on 05/05/24 at 7:30am and letter has been sent out to patient.

## 2024-04-23 DIAGNOSIS — J439 Emphysema, unspecified: Secondary | ICD-10-CM | POA: Diagnosis not present

## 2024-04-23 DIAGNOSIS — J449 Chronic obstructive pulmonary disease, unspecified: Secondary | ICD-10-CM | POA: Diagnosis not present

## 2024-04-28 DIAGNOSIS — Z8546 Personal history of malignant neoplasm of prostate: Secondary | ICD-10-CM | POA: Diagnosis not present

## 2024-04-28 DIAGNOSIS — J439 Emphysema, unspecified: Secondary | ICD-10-CM | POA: Diagnosis not present

## 2024-04-28 DIAGNOSIS — E7800 Pure hypercholesterolemia, unspecified: Secondary | ICD-10-CM | POA: Diagnosis not present

## 2024-04-28 DIAGNOSIS — J449 Chronic obstructive pulmonary disease, unspecified: Secondary | ICD-10-CM | POA: Diagnosis not present

## 2024-04-30 ENCOUNTER — Encounter (HOSPITAL_COMMUNITY): Payer: Self-pay | Admitting: Emergency Medicine

## 2024-04-30 ENCOUNTER — Other Ambulatory Visit: Payer: Self-pay

## 2024-04-30 NOTE — Progress Notes (Signed)
 SDW CALL  Patient was given pre-op instructions over the phone. The opportunity was given for the patient to ask questions. No further questions asked. Patient verbalized understanding of instructions given.   PCP - Cheryle Frees Cardiologist - denies  PPM/ICD - denies   Chest x-ray - CT - 04/16/24 EKG - denies Stress Test - denies ECHO - denies Cardiac Cath - denies  Sleep Study - denies  No DM  Last dose of GLP1 agonist-  n/a GLP1 instructions:  n/a  Blood Thinner Instructions: n/a Aspirin Instructions:n/a  ERAS Protcol - NPO   COVID TEST- n/a   Anesthesia review: no  Patient denies shortness of breath, fever, cough and chest pain over the phone call   All instructions explained to the patient, with a verbal understanding of the material. Patient agrees to go over the instructions while at home for a better understanding.

## 2024-05-04 NOTE — Anesthesia Preprocedure Evaluation (Signed)
 Anesthesia Evaluation  Patient identified by MRN, date of birth, ID band Patient awake    Reviewed: Allergy & Precautions, NPO status , Patient's Chart, lab work & pertinent test results, reviewed documented beta blocker date and time   History of Anesthesia Complications Negative for: history of anesthetic complications  Airway Mallampati: III  TM Distance: >3 FB Neck ROM: Limited    Dental no notable dental hx.    Pulmonary shortness of breath and with exertion, COPD,  COPD inhaler, Current Smoker and Patient abstained from smoking., neg PE   breath sounds clear to auscultation       Cardiovascular (-) hypertension(-) angina (-) CAD and (-) Past MI negative cardio ROS  Rhythm:Regular Rate:Normal     Neuro/Psych  Headaches, neg Seizures    GI/Hepatic PUD,,,(+) neg Cirrhosis        Endo/Other    Renal/GU Renal disease     Musculoskeletal   Abdominal   Peds  Hematology   Anesthesia Other Findings   Reproductive/Obstetrics                              Anesthesia Physical Anesthesia Plan  ASA: 3  Anesthesia Plan: General   Post-op Pain Management:    Induction: Intravenous  PONV Risk Score and Plan: 2 and Ondansetron  and Dexamethasone   Airway Management Planned: Oral ETT and Video Laryngoscope Planned  Additional Equipment:   Intra-op Plan:   Post-operative Plan: Extubation in OR  Informed Consent: I have reviewed the patients History and Physical, chart, labs and discussed the procedure including the risks, benefits and alternatives for the proposed anesthesia with the patient or authorized representative who has indicated his/her understanding and acceptance.       Plan Discussed with: CRNA  Anesthesia Plan Comments:          Anesthesia Quick Evaluation

## 2024-05-05 ENCOUNTER — Telehealth: Payer: Self-pay | Admitting: Emergency Medicine

## 2024-05-05 ENCOUNTER — Encounter (HOSPITAL_COMMUNITY): Payer: Self-pay | Admitting: Emergency Medicine

## 2024-05-05 ENCOUNTER — Ambulatory Visit (HOSPITAL_COMMUNITY)
Admission: RE | Admit: 2024-05-05 | Discharge: 2024-05-05 | Disposition: A | Discharge status: Home / Self Care | Attending: Emergency Medicine | Admitting: Emergency Medicine

## 2024-05-05 ENCOUNTER — Ambulatory Visit (HOSPITAL_COMMUNITY)

## 2024-05-05 ENCOUNTER — Encounter (HOSPITAL_COMMUNITY): Payer: Self-pay | Admitting: Anesthesiology

## 2024-05-05 ENCOUNTER — Encounter (HOSPITAL_COMMUNITY)
Admission: RE | Disposition: A | Discharge status: Home / Self Care | Payer: Self-pay | Source: Home / Self Care | Attending: Emergency Medicine

## 2024-05-05 ENCOUNTER — Ambulatory Visit (HOSPITAL_COMMUNITY): Payer: Self-pay | Admitting: Anesthesiology

## 2024-05-05 DIAGNOSIS — Z8546 Personal history of malignant neoplasm of prostate: Secondary | ICD-10-CM | POA: Diagnosis not present

## 2024-05-05 DIAGNOSIS — Z48813 Encounter for surgical aftercare following surgery on the respiratory system: Secondary | ICD-10-CM | POA: Diagnosis not present

## 2024-05-05 DIAGNOSIS — F17210 Nicotine dependence, cigarettes, uncomplicated: Secondary | ICD-10-CM | POA: Diagnosis not present

## 2024-05-05 DIAGNOSIS — J449 Chronic obstructive pulmonary disease, unspecified: Secondary | ICD-10-CM | POA: Diagnosis not present

## 2024-05-05 DIAGNOSIS — J8410 Pulmonary fibrosis, unspecified: Secondary | ICD-10-CM | POA: Diagnosis not present

## 2024-05-05 DIAGNOSIS — J984 Other disorders of lung: Secondary | ICD-10-CM | POA: Diagnosis not present

## 2024-05-05 DIAGNOSIS — R918 Other nonspecific abnormal finding of lung field: Secondary | ICD-10-CM | POA: Insufficient documentation

## 2024-05-05 DIAGNOSIS — J850 Gangrene and necrosis of lung: Secondary | ICD-10-CM | POA: Diagnosis not present

## 2024-05-05 DIAGNOSIS — R9389 Abnormal findings on diagnostic imaging of other specified body structures: Secondary | ICD-10-CM | POA: Diagnosis not present

## 2024-05-05 HISTORY — DX: Dyspnea, unspecified: R06.00

## 2024-05-05 HISTORY — PX: BRONCHIAL NEEDLE ASPIRATION BIOPSY: SHX5106

## 2024-05-05 HISTORY — PX: BRONCHIAL BIOPSY: SHX5109

## 2024-05-05 HISTORY — PX: VIDEO BRONCHOSCOPY WITH ENDOBRONCHIAL NAVIGATION: SHX6175

## 2024-05-05 HISTORY — PX: BRONCHIAL BRUSHINGS: SHX5108

## 2024-05-05 HISTORY — PX: BRONCHIAL WASHINGS: SHX5105

## 2024-05-05 LAB — CBC
HCT: 40.5 % (ref 39.0–52.0)
Hemoglobin: 13.5 g/dL (ref 13.0–17.0)
MCH: 31.6 pg (ref 26.0–34.0)
MCHC: 33.3 g/dL (ref 30.0–36.0)
MCV: 94.8 fL (ref 80.0–100.0)
Platelets: 176 K/uL (ref 150–400)
RBC: 4.27 MIL/uL (ref 4.22–5.81)
RDW: 12.9 % (ref 11.5–15.5)
WBC: 4.8 K/uL (ref 4.0–10.5)
nRBC: 0 % (ref 0.0–0.2)

## 2024-05-05 SURGERY — VIDEO BRONCHOSCOPY WITH ENDOBRONCHIAL NAVIGATION
Anesthesia: General | Laterality: Left

## 2024-05-05 MED ORDER — CHLORHEXIDINE GLUCONATE 0.12 % MT SOLN
OROMUCOSAL | Status: AC
  Administered 2024-05-05: 15 mL
  Filled 2024-05-05: qty 15

## 2024-05-05 MED ORDER — ONDANSETRON HCL 4 MG/2ML IJ SOLN: 4.0000 mg | Freq: Once | INTRAMUSCULAR | Status: DC | PRN

## 2024-05-05 MED ORDER — ACETAMINOPHEN 10 MG/ML IV SOLN: 1000.0000 mg | Freq: Once | INTRAVENOUS | Status: DC | PRN

## 2024-05-05 MED ORDER — PROPOFOL 500 MG/50ML IV EMUL
INTRAVENOUS | Status: DC | PRN
  Administered 2024-05-05: 125 ug/kg/min via INTRAVENOUS

## 2024-05-05 MED ORDER — ONDANSETRON HCL 4 MG/2ML IJ SOLN
INTRAMUSCULAR | Status: DC | PRN
  Administered 2024-05-05: 4 mg via INTRAVENOUS

## 2024-05-05 MED ORDER — DEXAMETHASONE SODIUM PHOSPHATE 10 MG/ML IJ SOLN
INTRAMUSCULAR | Status: DC | PRN
  Administered 2024-05-05: 10 mg via INTRAVENOUS

## 2024-05-05 MED ORDER — OXYCODONE HCL 5 MG PO TABS: 5.0000 mg | ORAL_TABLET | Freq: Once | ORAL | Status: DC | PRN

## 2024-05-05 MED ORDER — LIDOCAINE 2% (20 MG/ML) 5 ML SYRINGE
INTRAMUSCULAR | Status: DC | PRN
  Administered 2024-05-05: 100 mg via INTRAVENOUS

## 2024-05-05 MED ORDER — FENTANYL CITRATE (PF) 100 MCG/2ML IJ SOLN: 25.0000 ug | INTRAMUSCULAR | Status: DC | PRN

## 2024-05-05 MED ORDER — FENTANYL CITRATE (PF) 100 MCG/2ML IJ SOLN
INTRAMUSCULAR | Status: AC
  Filled 2024-05-05: qty 2

## 2024-05-05 MED ORDER — PHENYLEPHRINE HCL (PRESSORS) 10 MG/ML IV SOLN
INTRAVENOUS | Status: DC | PRN
  Administered 2024-05-05: 160 ug via INTRAVENOUS
  Administered 2024-05-05 (×3): 80 ug via INTRAVENOUS

## 2024-05-05 MED ORDER — OXYCODONE HCL 5 MG/5ML PO SOLN: 5.0000 mg | Freq: Once | ORAL | Status: DC | PRN

## 2024-05-05 MED ORDER — PROPOFOL 10 MG/ML IV BOLUS
INTRAVENOUS | Status: DC | PRN
  Administered 2024-05-05: 100 mg via INTRAVENOUS

## 2024-05-05 MED ORDER — PROPOFOL 1000 MG/100ML IV EMUL
INTRAVENOUS | Status: AC
  Filled 2024-05-05: qty 500

## 2024-05-05 MED ORDER — SUGAMMADEX SODIUM 200 MG/2ML IV SOLN
INTRAVENOUS | Status: DC | PRN
  Administered 2024-05-05: 150 mg via INTRAVENOUS

## 2024-05-05 MED ORDER — FENTANYL CITRATE (PF) 250 MCG/5ML IJ SOLN
INTRAMUSCULAR | Status: DC | PRN
Start: 2024-05-05 — End: 2024-05-05
  Administered 2024-05-05 (×2): 50 ug via INTRAVENOUS

## 2024-05-05 MED ORDER — LACTATED RINGERS IV SOLN: INTRAVENOUS | Status: DC

## 2024-05-05 MED ORDER — ROCURONIUM BROMIDE 10 MG/ML (PF) SYRINGE
PREFILLED_SYRINGE | INTRAVENOUS | Status: DC | PRN
  Administered 2024-05-05: 60 mg via INTRAVENOUS

## 2024-05-05 SURGICAL SUPPLY — 36 items
ADAPTER BRONCHOSCOPE OLYMPUS (ADAPTER) ×2 IMPLANT
ADAPTER VALVE BIOPSY EBUS (MISCELLANEOUS) IMPLANT
BAG COUNTER SPONGE SURGICOUNT (BAG) ×2 IMPLANT
BRUSH CYTOL CELLEBRITY 1.5X140 (MISCELLANEOUS) ×2 IMPLANT
BRUSH SUPERTRAX BIOPSY (INSTRUMENTS) IMPLANT
BRUSH SUPERTRAX NDL-TIP CYTO (INSTRUMENTS) ×2 IMPLANT
CANISTER SUCTION 3000ML PPV (SUCTIONS) ×2 IMPLANT
CNTNR URN SCR LID CUP LEK RST (MISCELLANEOUS) ×2 IMPLANT
COVER BACK TABLE 60X90IN (DRAPES) ×2 IMPLANT
FILTER STRAW FLUID ASPIR (MISCELLANEOUS) IMPLANT
FORCEPS BIOP 1.5 SINGLE USE (MISCELLANEOUS) ×2 IMPLANT
FORCEPS BIOP SUPERTRX PREMAR (INSTRUMENTS) ×2 IMPLANT
GAUZE SPONGE 4X4 12PLY STRL (GAUZE/BANDAGES/DRESSINGS) ×2 IMPLANT
GLOVE BIO SURGEON STRL SZ7.5 (GLOVE) ×4 IMPLANT
GOWN STRL REUS W/ TWL LRG LVL3 (GOWN DISPOSABLE) ×4 IMPLANT
KIT CLEAN ENDO COMPLIANCE (KITS) ×2 IMPLANT
KIT LOCATABLE GUIDE (CANNULA) IMPLANT
KIT MARKER FIDUCIAL DELIVERY (KITS) IMPLANT
KIT TURNOVER KIT B (KITS) ×2 IMPLANT
MARKER SKIN DUAL TIP RULER LAB (MISCELLANEOUS) ×2 IMPLANT
NDL SUPERTRX PREMARK BIOPSY (NEEDLE) ×2 IMPLANT
NEEDLE SUPERTRX PREMARK BIOPSY (NEEDLE) ×2 IMPLANT
NS IRRIG 1000ML POUR BTL (IV SOLUTION) ×2 IMPLANT
OIL SILICONE PENTAX (PARTS (SERVICE/REPAIRS)) ×2 IMPLANT
PAD ARMBOARD POSITIONER FOAM (MISCELLANEOUS) ×4 IMPLANT
PATCHES PATIENT (LABEL) ×6 IMPLANT
SYR 20ML ECCENTRIC (SYRINGE) ×2 IMPLANT
SYR 20ML LL LF (SYRINGE) ×2 IMPLANT
SYR 50ML SLIP (SYRINGE) ×2 IMPLANT
TOWEL GREEN STERILE FF (TOWEL DISPOSABLE) ×2 IMPLANT
TRAP SPECIMEN MUCUS 40CC (MISCELLANEOUS) IMPLANT
TUBE CONNECTING 20X1/4 (TUBING) ×2 IMPLANT
UNDERPAD 30X36 HEAVY ABSORB (UNDERPADS AND DIAPERS) ×2 IMPLANT
VALVE BIOPSY SINGLE USE (MISCELLANEOUS) ×2 IMPLANT
VALVE SUCTION BRONCHIO DISP (MISCELLANEOUS) ×2 IMPLANT
WATER STERILE IRR 1000ML POUR (IV SOLUTION) ×2 IMPLANT

## 2024-05-05 NOTE — Anesthesia Postprocedure Evaluation (Signed)
 Anesthesia Post Note  Patient: Brent Phelps  Procedure(s) Performed: VIDEO BRONCHOSCOPY WITH ENDOBRONCHIAL NAVIGATION (Left) BRONCHOSCOPY, WITH NEEDLE ASPIRATION BIOPSY BRONCHOSCOPY, WITH BRUSH BIOPSY BRONCHOSCOPY, WITH BIOPSY IRRIGATION, BRONCHUS     Patient location during evaluation: PACU Anesthesia Type: General Level of consciousness: awake and alert Pain management: pain level controlled Vital Signs Assessment: post-procedure vital signs reviewed and stable Respiratory status: spontaneous breathing, nonlabored ventilation, respiratory function stable and patient connected to nasal cannula oxygen Cardiovascular status: blood pressure returned to baseline and stable Postop Assessment: no apparent nausea or vomiting Anesthetic complications: no   No notable events documented.  Last Vitals:  Vitals:   05/05/24 0915 05/05/24 0930  BP: 120/64 116/61  Pulse: 95 91  Resp: (!) 28 (!) 22  Temp:  36.6 C  SpO2: 90% 92%    Last Pain:  Vitals:   05/05/24 0930  TempSrc:   PainSc: 0-No pain                 Lynwood MARLA Cornea

## 2024-05-05 NOTE — Op Note (Signed)
 Procedure Note  Patient: Brent Phelps  [employer] Cios mobile C-arm was utilized to identify and biopsy left upper lobe opacity.  Needle-in-lesion was confirmed using real-time Cios imaging, and images were uploaded to PACS.   Lamar Chris, MD, PhD 05/05/2024, 8:35 AM  Pulmonary [facility] Care [phone] or if no answer before 7:00PM call [phone] For any issues after 7:00PM please call eLink [phone]

## 2024-05-05 NOTE — Op Note (Signed)
 Video Bronchoscopy with Robotic Assisted Bronchoscopic Navigation   Date of Operation: 05/05/2024   Pre-op Diagnosis: Left lung opacity (left upper lobe and left lower lobe involvement)  Post-op Diagnosis: Same  Surgeon: Lamar Chris  Assistants: None  Anesthesia: General endotracheal anesthesia  Operation: Flexible video fiberoptic bronchoscopy with robotic assistance and biopsies.  Estimated Blood Loss: Minimal  Complications: None  Indications and History: Brent Phelps is a 78 y.o. male with history of tobacco use and COPD, prostate cancer.  He has an enlarging irregular left upper lobe and now left lower lobe opacity of unclear cause.  Recommendation made to achieve a tissue diagnosis to obtain culture data via robotic assisted navigational bronchoscopy.  The risks, benefits, complications, treatment options and expected outcomes were discussed with the patient.  The possibilities of pneumothorax, pneumonia, reaction to medication, pulmonary aspiration, perforation of a viscus, bleeding, failure to diagnose a condition and creating a complication requiring transfusion or operation were discussed with the patient who freely signed the consent.    Description of Procedure: The patient was seen in the Preoperative Area, was examined and was deemed appropriate to proceed.  The patient was taken to [facility] Endoscopy room 3, identified as Brent Phelps and the procedure verified as Flexible Video Fiberoptic Bronchoscopy.  A Time Out was held and the above information confirmed.   Prior to the date of the procedure a high-resolution CT scan of the chest was performed. Utilizing ION software program a virtual tracheobronchial tree was generated to allow the creation of distinct navigation pathways to the patient's parenchymal abnormalities. After being taken to the operating room general anesthesia was initiated and the patient  was orally intubated. The video fiberoptic bronchoscope was  introduced via the endotracheal tube and a general inspection was performed which showed a lobulated irregularity of the distal bronchus intermedius without any discrete endobronchial lesion.  Otherwise normal right and left lung anatomy. Aspiration of the bilateral mainstems was completed to remove any remaining secretions. Robotic catheter inserted into patient's endotracheal tube.   Target #1 left upper lobe opacity: The distinct navigation pathways prepared prior to this procedure were then utilized to navigate to patient's lesion identified on CT scan. The robotic catheter was secured into place and the vision probe was withdrawn.  Lesion location was approximated using fluoroscopy.  Local registration and targeting was performed using [employer] Cios mobile C-arm three-dimensional imaging. Under fluoroscopic guidance transbronchial needle brushings, transbronchial needle biopsies, and transbronchial forceps biopsies were performed to be sent for cytology and pathology.  Needle-in-lesion was confirmed using Cios mobile C-arm.  A bronchioalveolar lavage was performed in the left upper lobe and sent for microbiology.     At the end of the procedure a general airway inspection was performed and there was no evidence of active bleeding. The bronchoscope was removed.  The patient tolerated the procedure well. There was no significant blood loss and there were no obvious complications. A post-procedural chest x-ray is pending.  Samples Target #1: 1. Transbronchial needle brushings from left upper lobe opacity 2. Transbronchial Wang needle biopsies from left upper lobe opacity 3. Transbronchial forceps biopsies from left upper lobe opacity 4. Bronchoalveolar lavage from left upper lobe    Plans:  The patient will be discharged from the PACU to home when recovered from anesthesia and after chest x-ray is reviewed. We will review the cytology, pathology and microbiology results with the  patient when they become available. Outpatient followup will be with Dr. Kara and Dr.  Niquan Charnley.    Lamar Chris, MD, PhD 05/05/2024, 8:32 AM [facility] [phone] or if no answer before 7:00PM call [phone] For any issues after 7:00PM please call eLink [phone]  \

## 2024-05-05 NOTE — H&P (Signed)
 Brent Phelps is an 78 y.o. male.   Chief Complaint: Left upper lobe mass HPI: 78 year old man followed by Dr. Kara in our office.  He has a history of tobacco use, COPD, prostate cancer.  He also right upper lobe pulmonary nodule for which he underwent navigational bronchoscopy 01/2022, ultimately showed necrosis and benign findings.  He has been followed with serial chest imagine.  His right upper lobe nodularity has not significantly changed over time.  He has an enlarging partially cystic left upper lobe lesion now extending posteriorly into the left lower lobe.  Measures approximately 7.6 x 2.4 cm.  Question inflammatory change versus malignancy.  Recommendation made to perform navigational bronchoscopy for culture data and tissue diagnosis.  He reports that he continues smoke about 5 cigarettes daily.  He denies any chest discomfort.  He has some cough, minimally productive, stable mucus burden.  Denies any dyspnea.  He is here today with his daughter Brent Phelps.    Past Medical History:  Diagnosis Date   COPD (chronic obstructive pulmonary disease) (HCC)    COVID 01/20/2022   pt states no symptoms   Dyspnea    with exertion   Elevated prostate specific antigen (PSA)    Erectile dysfunction    Headache    Prostate cancer (HCC)    Shortness of breath on exertion     Past Surgical History:  Procedure Laterality Date   ANAL FISSURE REPAIR  AGE 11   BRONCHIAL BIOPSY  02/06/2022   Procedure: BRONCHIAL BIOPSIES;  Surgeon: Shelah Lamar RAMAN, MD;  Location: [facility] ENDOSCOPY;  Service: Pulmonary;;   BRONCHIAL BRUSHINGS  02/06/2022   Procedure: BRONCHIAL BRUSHINGS;  Surgeon: Shelah Lamar RAMAN, MD;  Location: [facility] ENDOSCOPY;  Service: Pulmonary;;   BRONCHIAL NEEDLE ASPIRATION BIOPSY  02/06/2022   Procedure: BRONCHIAL NEEDLE ASPIRATION BIOPSIES;  Surgeon: Shelah Lamar RAMAN, MD;  Location: [facility] ENDOSCOPY;  Service: Pulmonary;;   BRONCHIAL WASHINGS  02/06/2022   Procedure: BRONCHIAL WASHINGS;  Surgeon: Shelah Lamar RAMAN, MD;  Location: [facility] ENDOSCOPY;  Service: Pulmonary;;   CYSTOSCOPY  06/20/2012   Procedure: PHYLLIS SIDE;  Surgeon: Arlena LILLETTE Gal, MD;  Location: [facility];  Service: Urology;  Laterality: N/A;  no seeds found in bladder   CYSTOSCOPY  06/20/2012   Procedure: CYSTOSCOPY;  Surgeon: Arlena LILLETTE Gal, MD;  Location: [facility];  Service: Urology;;   ESOPHAGOGASTRODUODENOSCOPY N/A 07/11/2013   Procedure: ESOPHAGOGASTRODUODENOSCOPY (EGD);  Surgeon: Lynwood LITTIE Celestia Mickey., MD;  Location: [facility] ENDOSCOPY;  Service: Endoscopy;  Laterality: N/A;   FIDUCIAL MARKER PLACEMENT  02/06/2022   Procedure: FIDUCIAL MARKER PLACEMENT;  Surgeon: Shelah Lamar RAMAN, MD;  Location: [facility] ENDOSCOPY;  Service: Pulmonary;;   INJECTION OF SILICONE OIL Left 05/22/2013   Procedure: INJECTION OF SILICONE OIL;  Surgeon: Arley DELENA Ruder, MD;  Location: [facility] OR;  Service: Ophthalmology;  Laterality: Left;   PARS PLANA VITRECTOMY Left 05/22/2013   Procedure: PARS PLANA VITRECTOMY WITH 20 GAUGE WITH  ENDO LASER AND SILICONE INJECTION LEFT EYE; Reconstruction Ruptured globe, Drainage subretinal hematoma; Evacuation of anterior chamber clot.;  Surgeon: Arley DELENA Ruder, MD;  Location: [facility] OR;  Service: Ophthalmology;  Laterality: Left;   PHOTOCOAGULATION WITH LASER Left 05/22/2013   Procedure: PHOTOCOAGULATION WITH LASER;  Surgeon: Arley DELENA Ruder, MD;  Location: [facility] OR;  Service: Ophthalmology;  Laterality: Left;   RADIOACTIVE SEED IMPLANT  06/20/2012   Procedure: RADIOACTIVE SEED IMPLANT;  Surgeon: Arlena LILLETTE Gal, MD;  Location: [facility];  Service:  Urology;  Laterality: N/A;  83 seeds implanted   RUPTURED GLOBE EXPLORATION AND REPAIR Left 05/12/2013   Procedure: REPAIR OF RUPTURED GLOBE WITH CLOSURE OF LACERATION LOWER EYELID;  Surgeon: Ozell DELENA Mirza, MD;  Location: [facility] OR;  Service: Ophthalmology;  Laterality: Left;   THORACIC FUSION  1992     RE-DO T9 - T10 (PRIOR DISKECTOMY IN  1991)   VIDEO BRONCHOSCOPY WITH RADIAL ENDOBRONCHIAL ULTRASOUND  02/06/2022   Procedure: VIDEO BRONCHOSCOPY WITH RADIAL ENDOBRONCHIAL ULTRASOUND;  Surgeon: Shelah Lamar RAMAN, MD;  Location: [facility] ENDOSCOPY;  Service: Pulmonary;;    Family History  Problem Relation Age of Onset   Cancer Brother        prostate   Cancer Sister        breast   Cancer Brother        colon   Social History:  reports that he has been smoking cigarettes. He has a 12.5 pack-year smoking history. He has never used smokeless tobacco. He reports that he does not drink alcohol and does not use drugs.  Allergies:  Allergies  Allergen Reactions   Codeine Nausea And Vomiting    Can take for a short time     Medications Prior to Admission  Medication Sig Dispense Refill   albuterol  (VENTOLIN  HFA) 108 (90 Base) MCG/ACT inhaler INHALE 2 PUFFS INTO THE LUNGS EVERY 6 HOURS AS NEEDED FOR WHEEZING 6.7 g 11   fluticasone -salmeterol (ADVAIR HFA) 230-21 MCG/ACT inhaler INHALE 2 PUFFS INTO THE LUNGS TWICE DAILY 12 g 2   albuterol  (PROVENTIL ) (2.5 MG/3ML) 0.083% nebulizer solution USE 1 VIAL IN NEBULIZER EVERY 6 HOURS - as needed for wheezing or shortness of breath 3 mL 11   amoxicillin -clavulanate (AUGMENTIN ) 875-125 MG tablet Take 1 tablet by mouth 2 (two) times daily. 14 tablet 0    Results for orders placed or performed during the hospital encounter of 05/05/24 (from the past 48 hours)  CBC per protocol     Status: None   Collection Time: 05/05/24  6:19 AM  Result Value Ref Range   WBC 4.8 4.0 - 10.5 K/uL   RBC 4.27 4.22 - 5.81 MIL/uL   Hemoglobin 13.5 13.0 - 17.0 g/dL   HCT 59.4 60.9 - 47.9 %   MCV 94.8 80.0 - 100.0 fL   MCH 31.6 26.0 - 34.0 pg   MCHC 33.3 30.0 - 36.0 g/dL   RDW 87.0 88.4 - 84.4 %   Platelets 176 150 - 400 K/uL   nRBC 0.0 0.0 - 0.2 %    Comment: Performed at [facility], [address], [place], [place] [address]   No results found.  Review of Systems As per HPI Blood pressure (!)  147/83, pulse 75, temperature 98 F (36.7 C), temperature source Oral, resp. rate 20, height 5' 7 (1.702 m), weight 68 kg, SpO2 95%. Physical Exam   Gen: Pleasant, well-nourished, in no distress,  normal affect  ENT: No lesions,  mouth clear,  oropharynx clear, no postnasal drip, somewhat hoarse voice  Neck: No JVD, no stridor  Lungs: No use of accessory muscles, distant, no wheeze or crackles  Cardiovascular: RRR, heart sounds normal, no murmur or gallops, no peripheral edema  Abdomen: soft and NT, no HSM,  BS normal  Musculoskeletal: No deformities, no cyanosis or clubbing  Neuro: alert, awake, non focal  Skin: Warm, no lesions or rashes    Assessment/Plan Enlarging left upper lobe opacity in a patient with a history of tobacco use.  Concerning for either malignancy or possible indolent inflammatory/infectious process.  His right upper lobe nodularity has been stable on serial imaging following bronchoscopy in 2023 with benign biopsies. Recommend navigational bronchoscopy, biopsies and culture data from the right upper lobe/right lower lobe process.  He understands the risks, benefits, rationale and agrees to proceed.  No barriers identified.  Lamar GORMAN Chris, MD 05/05/2024, 7:21 AM

## 2024-05-05 NOTE — Discharge Instructions (Addendum)

## 2024-05-05 NOTE — Telephone Encounter (Signed)
 Patient needs an OV w Dr Kara to review bronchoscopy biopsy and culture results, sometime next 2-3 weeks. If unavailable, then he could see SG or RB. Thanks.

## 2024-05-05 NOTE — Anesthesia Procedure Notes (Signed)
 Procedure Name: Intubation Date/Time: 05/05/2024 7:40 AM  Performed by: Shlomo Tinnie SAILOR, RNPre-anesthesia Checklist: Patient identified, Emergency Drugs available, Suction available and Patient being monitored Patient Re-evaluated:Patient Re-evaluated prior to induction Oxygen Delivery Method: Circle System Utilized Preoxygenation: Pre-oxygenation with 100% oxygen Induction Type: IV induction Ventilation: Mask ventilation without difficulty Laryngoscope Size: Mac and 4 Grade View: Grade I Tube type: Oral Tube size: 8.5 mm Number of attempts: 1 Airway Equipment and Method: Stylet and Oral airway Placement Confirmation: ETT inserted through vocal cords under direct vision, positive ETCO2 and breath sounds checked- equal and bilateral Secured at: 23 cm Tube secured with: Tape Dental Injury: Teeth and Oropharynx as per pre-operative assessment

## 2024-05-05 NOTE — Transfer of Care (Signed)
 Immediate Anesthesia Transfer of Care Note  Patient: Brent Phelps  Procedure(s) Performed: VIDEO BRONCHOSCOPY WITH ENDOBRONCHIAL NAVIGATION (Left) BRONCHOSCOPY, WITH NEEDLE ASPIRATION BIOPSY BRONCHOSCOPY, WITH BRUSH BIOPSY BRONCHOSCOPY, WITH BIOPSY IRRIGATION, BRONCHUS  Patient Location: PACU  Anesthesia Type:General  Level of Consciousness: awake and patient cooperative  Airway & Oxygen Therapy: Patient Spontanous Breathing and Patient connected to face mask oxygen  Post-op Assessment: Report given to RN, Post -op Vital signs reviewed and stable, and Patient moving all extremities  Post vital signs: Reviewed and stable  Last Vitals:  Vitals Value Taken Time  BP 130/77 05/05/24 08:40  Temp    Pulse 83 05/05/24 08:43  Resp 21 05/05/24 08:43  SpO2 98 % 05/05/24 08:43  Vitals shown include unfiled device data.  Last Pain:  Vitals:   05/05/24 0621  TempSrc:   PainSc: 0-No pain         Complications: No notable events documented.

## 2024-05-06 ENCOUNTER — Encounter (HOSPITAL_COMMUNITY): Payer: Self-pay | Admitting: Emergency Medicine

## 2024-05-07 LAB — CULTURE, BAL-QUANTITATIVE W GRAM STAIN: Culture: NO GROWTH

## 2024-05-07 LAB — CYTOLOGY - NON PAP

## 2024-05-07 NOTE — Telephone Encounter (Signed)
 Called and spoke with Brent Phelps. Pt has been scheduled ov with Dr. Kara and is aware. Nothing further needed.

## 2024-05-15 ENCOUNTER — Ambulatory Visit: Admitting: Pulmonary Disease

## 2024-05-15 ENCOUNTER — Encounter: Payer: Self-pay | Admitting: Pulmonary Disease

## 2024-05-15 VITALS — BP 149/82 | HR 77 | Ht 66.0 in | Wt 152.4 lb

## 2024-05-15 DIAGNOSIS — J439 Emphysema, unspecified: Secondary | ICD-10-CM | POA: Diagnosis not present

## 2024-05-15 DIAGNOSIS — J432 Centrilobular emphysema: Secondary | ICD-10-CM

## 2024-05-15 DIAGNOSIS — R918 Other nonspecific abnormal finding of lung field: Secondary | ICD-10-CM

## 2024-05-15 NOTE — Progress Notes (Signed)
 Synopsis: Referred in December 2022 for COPD by Lamar Bernhardt, MD  Subjective:   PATIENT ID: Brent Phelps GENDER: male DOB: Dec 06, 1945, MRN: [id number]  HPI  Chief Complaint  Patient presents with   Follow-up    F/u to review culture and bronch results.   Brent Phelps is a a 78 year old male, daily smoker with history of prostate cancer who returns to pulmonary clinic for follow up of COPD and lung nodule.   He underwent Navigational bronchoscopy with biopsies on 7/7. Biopsies are negative for malignancy and indicate granulomatous inflammation. AFB culture is positive with pending speciation.   Review results with patient today. Will await final culture results before considering treatment plan. He is feeling more fatigued and has decreased appetite.    OV 02/20/24 No issues since last visit. Continues to smoke a few cigarettes per day. He is using advair 230-4.5mcg 2 puffs twice daily. Using albuterol  as needed.  Follow up nodule CT scan shows stable RUL nodule and shows new LUL nodule/mass concerning for inflammation vs infection given appearance with underlying emphysema.   OV 02/26/23 He was changed from symbicort 160-4.5mcg to advair HFA 115-21mcg after last visit due to insurance coverage. He is having more shortness of breath recently.   He is smoking 0 to 5-6 cigarettes daily.   OV 07/25/22 He reports no improvement in his breathing with trial of Breztri  and has continued to use symbicort. He is using albuterol  as needed. He is smoking on and off.  CT Chest 07/17/22 shows stable nodular denisty in the RUL.   OV 04/19/22 He underwent navigational bronchoscopy on 02/06/22 with biopsies showing necrotic tissue without evidence of malignancy.   He quit smoking for over a month and is back to smoking half a pack per day. He reports increased dyspnea of recent.   He is using symbicort 160-4.5mcg 2 puffs twice daily ad as needed albuterol  1-2 times per day  OV 01/10/22 He  reports no changes in his breathing since last visit.  He continues to have exertional dyspnea.  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  He is using Symbicort 2 puffs twice daily and as needed albuterol  inhaler and nebulizer treatments.  We reviewed CT chest scan in clinic today where the right upper lobe nodule/cavitary lesion remains stable over the past 3 months.  Discussed moving forward with a CT PET scan versus navigational bronchoscopy.  He wishes to proceed with biopsy at this time.  He quit smoking cigarettes 3 weeks ago.  OV 11/29/21 HRCT Chest 10/21/21 shows moderate centrilobular and paraseptal emphysema with diffuse bronchial wall thickening. There is a 1.1 x 0.9cm thick walled cavitary lesion of the right apex.   He continues on symbicort 160-4.5mcg 2 puffs twice daily. Using the nebulizer as needed with some relief along with albuterol  inhaler.   He is scheduled for PFTs next month. He had overnight oximitry done last month. He continues to smoke intermittently.  OV 10/04/21 Patient has been smoking since age of 19 and is currently smoking about 5 cigarettes/day.  He was previously smoking just under a pack per day until couple years ago.  He has been having progressive shortness of breath since 2018.  He was started on Symbicort inhaler in 2018 with improvement of his breathing symptoms but he has continued to have dyspnea with exertion since.  He was trialed on Trelegy Ellipta and did not notice much difference and  remains back on Symbicort.  He is using as needed albuterol  inhaler.  He uses the inhaler mostly with exertional activities such as yard work and working in his [employer].  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  He continues to work occasionally from his shop at home.  He does report sinus congestion.  He denies any cough or  sputum production.  He does report intermittent wheezing.  He had to quit golfing last year due to shortness of breath.  He denies any issues with sleeping at night.  No nighttime awakenings with shortness of breath.  He feels rested in the morning time.  Records reviewed from Dr. Hugh.   Past Medical History:  Diagnosis Date   COPD (chronic obstructive pulmonary disease) (HCC)    COVID 01/20/2022   pt states no symptoms   Dyspnea    with exertion   Elevated prostate specific antigen (PSA)    Erectile dysfunction    Headache    Prostate cancer (HCC)    Shortness of breath on exertion      Family History  Problem Relation Age of Onset   Cancer Brother        prostate   Cancer Sister        breast   Cancer Brother        colon     Social History   Socioeconomic History   Marital status: Widowed    Spouse name: Not on file   Number of children: Not on file   Years of education: Not on file   Highest education level: Not on file  Occupational History   Not on file  Tobacco Use   Smoking status: Some Days    Current packs/day: 0.25    Average packs/day: 0.3 packs/day for 50.0 years (12.5 ttl pk-yrs)    Types: Cigarettes   Smokeless tobacco: Never   Tobacco comments:    PT CUT DOWN FROM 1 1/2 PPD TO 6 CIG DAILY  Vaping Use   Vaping status: Never Used  Substance and Sexual Activity   Alcohol use: No   Drug use: No   Sexual activity: Not on file  Other Topics Concern   Not on file  Social History Narrative   Not on file   Social Drivers of Health   Financial Resource Strain: Not on file  Food Insecurity: Not on file  Transportation Needs: Not on file  Physical Activity: Not on file  Stress: Not on file  Social Connections: Not on file  Intimate Partner Violence: Not on file     Allergies  Allergen Reactions   Codeine Nausea And Vomiting    Can take for a short time      Outpatient Medications Prior to Visit  Medication Sig Dispense Refill    albuterol  (PROVENTIL ) (2.5 MG/3ML) 0.083% nebulizer solution USE 1 VIAL IN NEBULIZER EVERY 6 HOURS - as needed for wheezing or shortness of breath 3 mL 11   albuterol  (VENTOLIN  HFA) 108 (90 Base) MCG/ACT inhaler INHALE 2 PUFFS INTO THE LUNGS EVERY 6 HOURS AS NEEDED FOR WHEEZING 6.7 g 11   fluticasone -salmeterol (ADVAIR HFA) 230-21 MCG/ACT inhaler INHALE 2 PUFFS INTO THE LUNGS TWICE DAILY 12 g 2   No facility-administered medications prior to visit.   Review of Systems  Constitutional:  Positive for malaise/fatigue and weight loss. Negative for chills and fever.  HENT:  Negative for congestion, sinus  pain and sore throat.   Eyes: Negative.   Respiratory:  Positive for shortness of breath. Negative for cough, hemoptysis, sputum production and wheezing.   Cardiovascular:  Negative for chest pain, palpitations, orthopnea, claudication and leg swelling.  Gastrointestinal:  Negative for abdominal pain, heartburn, nausea and vomiting.  Genitourinary: Negative.   Skin:  Negative for rash.    Objective:   Vitals:   05/15/24 1321 05/15/24 1323  BP: (!) 149/82 (!) 149/82  Pulse: 77   SpO2: 94%   Weight: 152 lb 6.4 oz (69.1 kg)   Height: 5' 6 (1.676 m)     Physical Exam Constitutional:      General: He is not in acute distress. HENT:     Head: Normocephalic and atraumatic.  Cardiovascular:     Rate and Rhythm: Normal rate and regular rhythm.     Pulses: Normal pulses.     Heart sounds: Normal heart sounds. No murmur heard. Pulmonary:     Effort: Pulmonary effort is normal.     Breath sounds: Decreased air movement present. Decreased breath sounds present. No wheezing, rhonchi or rales.  Musculoskeletal:     Right lower leg: No edema.     Left lower leg: No edema.  Skin:    General: Skin is warm and dry.  Neurological:     General: No focal deficit present.     Mental Status: He is alert.    CBC    Component Value Date/Time   WBC 4.8 05/05/2024 0619   RBC 4.27 05/05/2024  0619   HGB 13.5 05/05/2024 0619   HCT 40.5 05/05/2024 0619   PLT 176 05/05/2024 0619   MCV 94.8 05/05/2024 0619   MCH 31.6 05/05/2024 0619   MCHC 33.3 05/05/2024 0619   RDW 12.9 05/05/2024 0619      Latest Ref Rng & Units 02/06/2022    8:58 AM 07/11/2013    4:30 AM 07/10/2013    9:37 PM  BMP  Glucose 70 - 99 mg/dL 92  889  849   BUN 8 - 23 mg/dL 14  40  46   Creatinine 0.61 - 1.24 mg/dL 9.03  9.29  9.26   Sodium 135 - 145 mmol/L 140  137  135   Potassium 3.5 - 5.1 mmol/L 4.5  3.8  3.8   Chloride 98 - 111 mmol/L 108  105  103   CO2 22 - 32 mmol/L 26  25  23    Calcium 8.9 - 10.3 mg/dL 9.1  8.1  9.0    Chest imaging: CT Chest 04/08/24 1. Significant enlargement of the irregular lesion in the left upper lobe. The lesion now extends into the left lower lobe. Findings are most suggestive for an infectious etiology but an aggressive neoplasm cannot be completely excluded. 2. Stable nodular densities in the right lung as described. 3. Aortic Atherosclerosis (ICD10-I70.0) and Emphysema (ICD10-J43.9).  CT Chest 01/16/24 1. Interval development of a mixed solid and cystic mass within the left upper lobe measuring at least 1.5 x 3.2 cm, indeterminate. While this may represent a an inflammatory process, a primary pulmonary neoplasm is not excluded. PET CT examination is recommended further evaluation. 2. Stable 6 mm right upper lobe pulmonary nodule, previously biopsied with a biopsy marker in place. 3. Stable ground-glass right upper lobe pulmonary nodule measuring 12 x 13 mm, clearly progressed since remote prior examination of 01/06/2022. Given its relatively slow progression a lesion the spectrum is favored. Continued follow-up imaging is warranted. 4. Stable  spiculated lesion left upper lobe within the perihilar region measuring 4 x 13 mm since remote prior examination of 01/06/2022.  CT Chest 01/09/23 1. Dominant solid apical segment right upper lobe nodule is stable from  07/17/2022 and appears more organized than on 10/21/2021, suggesting a postinfectious/postinflammatory etiology. Recommend continued attention on follow-up as malignancy cannot be definitively excluded. 2. Additional bilateral pulmonary nodules, stable from 10/21/2021. Recommend attention on follow-up as indolent adenocarcinomas cannot be excluded. 3. Aortic atherosclerosis (ICD10-I70.0). Coronary artery calcification. 4.  Emphysem  CT Chest 07/17/22 1. Persistent irregular shaped nodular density in the right upper lobe. Given the appearance on priors in the transition to predominantly solid appearance, this is favored to represent fluid within pre-existing areas of bronchiectasis. Strictly speaking, neoplasm is not entirely excluded, however, and accordingly continued attention on follow-up studies is recommended to ensure stability. Repeat noncontrast chest CT is suggested in 6 months. 2. Mild diffuse bronchial wall thickening with moderate centrilobular and paraseptal emphysema; imaging findings suggestive of underlying COPD. 3. Aortic atherosclerosis, in addition to left main and three-vessel coronary artery disease. Assessment for potential risk factor modification, dietary therapy or pharmacologic therapy may be warranted, if clinically indicated.  CT Chest 01/06/22 1. Persistent finding of a spiculated irregular nodule in the right upper lung measuring up to 1.2 cm diameter. Cavitation is present but less prominent than on the prior study. Consider PET-CT versus tissue sampling for further evaluation. 2. Emphysematous changes in the lungs. 3. Aortic atherosclerosis.  HRCT Chest 10/21/21 1. No evidence of fibrotic interstitial lung disease. 2. Emphysema and diffuse bilateral bronchial wall thickening. 3. There is a small thick-walled cavitary lesion of the right pulmonary apex measuring 1.1 x 0.9 cm. This is nonspecific and may be infectious or inflammatory although  suspicious for malignancy. Consider one of the following in 3 months for both low-risk and high-risk individuals: (a) repeat chest CT, (b) follow-up PET-CT, or (c) tissue sampling.   CT Chest 05/12/13 There is no pleural effusion.  There is moderate  centrilobular emphysema.  3 mm calcified granulomas identified in  the right upper lobe.  No airspace consolidation identified.  PFT:    Latest Ref Rng & Units 12/12/2021   10:47 AM  PFT Results  FVC-Pre L 1.94   FVC-Predicted Pre % 55   FVC-Post L 2.27   FVC-Predicted Post % 64   Pre FEV1/FVC % % 40   Post FEV1/FCV % % 43   FEV1-Pre L 0.77   FEV1-Predicted Pre % 31   FEV1-Post L 0.98   DLCO uncorrected ml/min/mmHg 15.53   DLCO UNC% % 70   DLCO corrected ml/min/mmHg 15.53   DLCO COR %Predicted % 70   DLVA Predicted % 82   PFTs: Mixed obstructive and restrictive defects with mild diffusion defect  Labs:  Path:  Echo:  Heart Catheterization:  Assessment & Plan:   Centrilobular emphysema (HCC)  Lung mass  Discussion: Taner Rzepka is a a 78 year old male, daily smoker with history of prostate cancer who returns to pulmonary clinic for follow up of COPD and pulmonary nodule.   Patient has history of centrilobular emphysema as noted on CT chest scan from 2014.  He has mixed obstructive and restrictive defects on PFTs along with mild diffusion defect.   He is to continue Advair HFA 230-21mcg 2 puffs twice daily. He did not benefit from breztri  inhaler. He can continue albuterol  inhaler or nebulizer treatment as needed.  The RUL nodule is stable in size. LUL lung  lesion was sampled 7/7 via navigation bronchoscopy and samples negative for cancer but AFB culture is positive. Will follow up speciation and determine treatment plan.   Will arrange further follow up once cultures return.  Brent Chill, MD [facility] Office: [phone]   Current Outpatient Medications:    albuterol  (PROVENTIL )  (2.5 MG/3ML) 0.083% nebulizer solution, USE 1 VIAL IN NEBULIZER EVERY 6 HOURS - as needed for wheezing or shortness of breath, Disp: 3 mL, Rfl: 11   albuterol  (VENTOLIN  HFA) 108 (90 Base) MCG/ACT inhaler, INHALE 2 PUFFS INTO THE LUNGS EVERY 6 HOURS AS NEEDED FOR WHEEZING, Disp: 6.7 g, Rfl: 11   fluticasone -salmeterol (ADVAIR HFA) 230-21 MCG/ACT inhaler, INHALE 2 PUFFS INTO THE LUNGS TWICE DAILY, Disp: 12 g, Rfl: 2

## 2024-05-15 NOTE — Patient Instructions (Addendum)
 Your bronchoscopy results are negative for cancer  The culture from the bronchoscopy is positive for a slow growing bacteria, we will follow up this culture and then discuss treatment options by the end of this month and early August.  We will arrange follow up once the results come back

## 2024-05-23 DIAGNOSIS — J449 Chronic obstructive pulmonary disease, unspecified: Secondary | ICD-10-CM | POA: Diagnosis not present

## 2024-05-23 DIAGNOSIS — J439 Emphysema, unspecified: Secondary | ICD-10-CM | POA: Diagnosis not present

## 2024-05-27 ENCOUNTER — Encounter: Payer: Self-pay | Admitting: Pulmonary Disease

## 2024-05-27 ENCOUNTER — Telehealth: Payer: Self-pay | Admitting: Pulmonary Disease

## 2024-05-27 DIAGNOSIS — R918 Other nonspecific abnormal finding of lung field: Secondary | ICD-10-CM

## 2024-05-27 DIAGNOSIS — A310 Pulmonary mycobacterial infection: Secondary | ICD-10-CM

## 2024-05-27 NOTE — Telephone Encounter (Signed)
 Referral placed to Infectious Disease for evaluation and treatment of MAC.  Patient notified via myChart message.  Dorn Chill, MD [facility] Office: [phone]   See Amion for personal pager PCCM on call pager [phone] until 7pm. Please call Elink 7p-7a. [phone]

## 2024-05-29 DIAGNOSIS — J439 Emphysema, unspecified: Secondary | ICD-10-CM | POA: Diagnosis not present

## 2024-05-29 DIAGNOSIS — J449 Chronic obstructive pulmonary disease, unspecified: Secondary | ICD-10-CM | POA: Diagnosis not present

## 2024-05-29 DIAGNOSIS — Z8546 Personal history of malignant neoplasm of prostate: Secondary | ICD-10-CM | POA: Diagnosis not present

## 2024-05-29 DIAGNOSIS — E7800 Pure hypercholesterolemia, unspecified: Secondary | ICD-10-CM | POA: Diagnosis not present

## 2024-06-10 ENCOUNTER — Encounter: Payer: Self-pay | Admitting: Infectious Disease

## 2024-06-10 DIAGNOSIS — A310 Pulmonary mycobacterial infection: Secondary | ICD-10-CM | POA: Insufficient documentation

## 2024-06-10 DIAGNOSIS — J449 Chronic obstructive pulmonary disease, unspecified: Secondary | ICD-10-CM | POA: Insufficient documentation

## 2024-06-10 DIAGNOSIS — F17200 Nicotine dependence, unspecified, uncomplicated: Secondary | ICD-10-CM | POA: Insufficient documentation

## 2024-06-10 HISTORY — DX: Chronic obstructive pulmonary disease, unspecified: J44.9

## 2024-06-10 NOTE — Progress Notes (Signed)
 Reason for Infectious Disease Infection: M avium pulmonary infection  Requesting Physician: Dorn Chill, MD   Subjective:    Patient ID: Brent Phelps, male    DOB: 11-07-1945, 78 y.o.   MRN: [id number]  HPI  Discussed the use of AI scribe software for clinical note transcription with the patient, who gave verbal consent to proceed.  History of Present Illness   Brent Phelps is an 78 year old male with, smoking history , prostate cancer and  COPD who presents for evaluation of a lung mass biopsied and found to have granulomatous pathology with Mycobacterium avium on culture   . He was referred by Dr. Chill following a bronchoscopy and biopsy of a lung mass.  A lung mass with an unusual shape was identified on CT scan and was spiculated and grew over time.    .A biopsy revealed granulomas, and cultures grew Mycobacterium avium, a bacterium that can infect individuals with compromised lung function, such as those with COPD.   It does NOT sound as if SYMPTOMS ever drove the CT scans that he has received.  Rather this appeared to be something found on CT's done for other reasons.   He hardly has much of a cough at all though he has experienced a slight  recent increase in coughing over the past month, primarily in the mornings. Cough is NOT productive. There is no significant weight loss, as any weight loss was initially intentional due to dietary changes. He maintains a good appetite and does not experience fevers or drenching sweats. He describes feeling fatigued, stating 'I just stay wore out all the time,' with some days requiring rest by mid-morning, although this varies day-to-day and then he can feel completely normal  His past medical history includes COPD and a history of smoking, which has contributed to his current lung condition. He also has a history of prostate cancer, which was caught early and has not metastasized.  He lives independently but has a son and  daughter who check on him regularly. He does not bring up significant sputum or phlegm when coughing, and any phlegm tends to be minimal and is often swallowed.       Past Medical History:  Diagnosis Date   COPD (chronic obstructive pulmonary disease) (HCC)    COVID 01/20/2022   pt states no symptoms   Dyspnea    with exertion   Elevated prostate specific antigen (PSA)    Erectile dysfunction    Headache    Prostate cancer (HCC)    Shortness of breath on exertion     Past Surgical History:  Procedure Laterality Date   ANAL FISSURE REPAIR  AGE 17   BRONCHIAL BIOPSY  02/06/2022   Procedure: BRONCHIAL BIOPSIES;  Surgeon: Shelah Lamar RAMAN, MD;  Location: [facility] ENDOSCOPY;  Service: Pulmonary;;   BRONCHIAL BIOPSY  05/05/2024   Procedure: BRONCHOSCOPY, WITH BIOPSY;  Surgeon: Shelah Lamar RAMAN, MD;  Location: [facility] ENDOSCOPY;  Service: Pulmonary;;   BRONCHIAL BRUSHINGS  02/06/2022   Procedure: BRONCHIAL BRUSHINGS;  Surgeon: Shelah Lamar RAMAN, MD;  Location: [facility] ENDOSCOPY;  Service: Pulmonary;;   BRONCHIAL BRUSHINGS  05/05/2024   Procedure: BRONCHOSCOPY, WITH BRUSH BIOPSY;  Surgeon: Shelah Lamar RAMAN, MD;  Location: [facility] ENDOSCOPY;  Service: Pulmonary;;   BRONCHIAL NEEDLE ASPIRATION BIOPSY  02/06/2022   Procedure: BRONCHIAL NEEDLE ASPIRATION BIOPSIES;  Surgeon: Shelah Lamar RAMAN, MD;  Location: [facility] ENDOSCOPY;  Service: Pulmonary;;   BRONCHIAL NEEDLE ASPIRATION BIOPSY  05/05/2024   Procedure: BRONCHOSCOPY,  WITH NEEDLE ASPIRATION BIOPSY;  Surgeon: Shelah Lamar RAMAN, MD;  Location: [facility] ENDOSCOPY;  Service: Pulmonary;;   BRONCHIAL WASHINGS  02/06/2022   Procedure: BRONCHIAL WASHINGS;  Surgeon: Shelah Lamar RAMAN, MD;  Location: [facility] ENDOSCOPY;  Service: Pulmonary;;   BRONCHIAL WASHINGS  05/05/2024   Procedure: IRRIGATION, BRONCHUS;  Surgeon: Shelah Lamar RAMAN, MD;  Location: [facility] ENDOSCOPY;  Service: Pulmonary;;   CYSTOSCOPY  06/20/2012   Procedure: PHYLLIS SIDE;  Surgeon: Arlena LILLETTE Gal, MD;  Location: [facility];  Service: Urology;  Laterality: N/A;  no seeds found in bladder   CYSTOSCOPY  06/20/2012   Procedure: CYSTOSCOPY;  Surgeon: Arlena LILLETTE Gal, MD;  Location: [facility];  Service: Urology;;   ESOPHAGOGASTRODUODENOSCOPY N/A 07/11/2013   Procedure: ESOPHAGOGASTRODUODENOSCOPY (EGD);  Surgeon: Lynwood LITTIE Celestia Mickey., MD;  Location: [facility] ENDOSCOPY;  Service: Endoscopy;  Laterality: N/A;   FIDUCIAL MARKER PLACEMENT  02/06/2022   Procedure: FIDUCIAL MARKER PLACEMENT;  Surgeon: Shelah Lamar RAMAN, MD;  Location: [facility] ENDOSCOPY;  Service: Pulmonary;;   INJECTION OF SILICONE OIL Left 05/22/2013   Procedure: INJECTION OF SILICONE OIL;  Surgeon: Arley DELENA Ruder, MD;  Location: [facility] OR;  Service: Ophthalmology;  Laterality: Left;   PARS PLANA VITRECTOMY Left 05/22/2013   Procedure: PARS PLANA VITRECTOMY WITH 20 GAUGE WITH  ENDO LASER AND SILICONE INJECTION LEFT EYE; Reconstruction Ruptured globe, Drainage subretinal hematoma; Evacuation of anterior chamber clot.;  Surgeon: Arley DELENA Ruder, MD;  Location: [facility] OR;  Service: Ophthalmology;  Laterality: Left;   PHOTOCOAGULATION WITH LASER Left 05/22/2013   Procedure: PHOTOCOAGULATION WITH LASER;  Surgeon: Arley DELENA Ruder, MD;  Location: [facility] OR;  Service: Ophthalmology;  Laterality: Left;   RADIOACTIVE SEED IMPLANT  06/20/2012   Procedure: RADIOACTIVE SEED IMPLANT;  Surgeon: Arlena LILLETTE Gal, MD;  Location: [facility];  Service: Urology;  Laterality: N/A;  83 seeds implanted   RUPTURED GLOBE EXPLORATION AND REPAIR Left 05/12/2013   Procedure: REPAIR OF RUPTURED GLOBE WITH CLOSURE OF LACERATION LOWER EYELID;  Surgeon: Ozell DELENA Mirza, MD;  Location: [facility] OR;  Service: Ophthalmology;  Laterality: Left;   THORACIC FUSION  1992     RE-DO T9 - T10 (PRIOR DISKECTOMY IN 1991)   VIDEO BRONCHOSCOPY WITH ENDOBRONCHIAL NAVIGATION Left 05/05/2024   Procedure: VIDEO BRONCHOSCOPY WITH ENDOBRONCHIAL NAVIGATION;  Surgeon: Shelah Lamar RAMAN, MD;  Location:  [facility] ENDOSCOPY;  Service: Pulmonary;  Laterality: Left;   VIDEO BRONCHOSCOPY WITH RADIAL ENDOBRONCHIAL ULTRASOUND  02/06/2022   Procedure: VIDEO BRONCHOSCOPY WITH RADIAL ENDOBRONCHIAL ULTRASOUND;  Surgeon: Shelah Lamar RAMAN, MD;  Location: [facility] ENDOSCOPY;  Service: Pulmonary;;    Family History  Problem Relation Age of Onset   Cancer Brother        prostate   Cancer Sister        breast   Cancer Brother        colon      Social History   Socioeconomic History   Marital status: Widowed    Spouse name: Not on file   Number of children: Not on file   Years of education: Not on file   Highest education level: Not on file  Occupational History   Not on file  Tobacco Use   Smoking status: Some Days    Current packs/day: 0.25    Average packs/day: 0.3 packs/day for 50.0 years (12.5 ttl pk-yrs)    Types: Cigarettes   Smokeless tobacco: Never   Tobacco comments:    PT CUT DOWN FROM 1 1/2  PPD TO 6 CIG DAILY  Vaping Use   Vaping status: Never Used  Substance and Sexual Activity   Alcohol use: No   Drug use: No   Sexual activity: Not on file  Other Topics Concern   Not on file  Social History Narrative   Not on file   Social Drivers of Health   Financial Resource Strain: Not on file  Food Insecurity: Not on file  Transportation Needs: Not on file  Physical Activity: Not on file  Stress: Not on file  Social Connections: Not on file    Allergies  Allergen Reactions   Codeine Nausea And Vomiting    Can take for a short time      Current Outpatient Medications:    albuterol  (PROVENTIL ) (2.5 MG/3ML) 0.083% nebulizer solution, USE 1 VIAL IN NEBULIZER EVERY 6 HOURS - as needed for wheezing or shortness of breath, Disp: 3 mL, Rfl: 11   albuterol  (VENTOLIN  HFA) 108 (90 Base) MCG/ACT inhaler, INHALE 2 PUFFS INTO THE LUNGS EVERY 6 HOURS AS NEEDED FOR WHEEZING, Disp: 6.7 g, Rfl: 11   fluticasone -salmeterol (ADVAIR HFA) 230-21 MCG/ACT inhaler, INHALE 2 PUFFS INTO THE LUNGS TWICE  DAILY, Disp: 12 g, Rfl: 2   Review of Systems  Constitutional:  Negative for activity change, appetite change, chills, diaphoresis, fatigue, fever and unexpected weight change.  HENT:  Negative for congestion, rhinorrhea, sinus pressure, sneezing, sore throat and trouble swallowing.   Eyes:  Negative for photophobia and visual disturbance.  Respiratory:  Positive for cough. Negative for chest tightness, shortness of breath, wheezing and stridor.   Cardiovascular:  Negative for chest pain, palpitations and leg swelling.  Gastrointestinal:  Negative for abdominal distention, abdominal pain, anal bleeding, blood in stool, constipation, diarrhea, nausea and vomiting.  Genitourinary:  Negative for difficulty urinating, dysuria, flank pain and hematuria.  Musculoskeletal:  Negative for arthralgias, back pain, gait problem, joint swelling and myalgias.  Skin:  Negative for color change, pallor, rash and wound.  Neurological:  Negative for dizziness, tremors, weakness and light-headedness.  Hematological:  Negative for adenopathy. Does not bruise/bleed easily.  Psychiatric/Behavioral:  Negative for agitation, behavioral problems, confusion, decreased concentration, dysphoric mood and sleep disturbance.        Objective:   Physical Exam Constitutional:      Appearance: He is well-developed.  HENT:     Head: Normocephalic and atraumatic.  Eyes:     Conjunctiva/sclera: Conjunctivae normal.  Cardiovascular:     Rate and Rhythm: Normal rate and regular rhythm.     Heart sounds: No murmur heard.    No friction rub. No gallop.  Pulmonary:     Effort: Prolonged expiration present. No respiratory distress.     Breath sounds: No stridor. No wheezing or rhonchi.  Abdominal:     General: There is no distension.     Palpations: Abdomen is soft.  Musculoskeletal:        General: No tenderness. Normal range of motion.     Cervical back: Normal range of motion and neck supple.  Skin:    General:  Skin is warm and dry.     Coloration: Skin is not pale.     Findings: No erythema or rash.  Neurological:     General: No focal deficit present.     Mental Status: He is alert and oriented to person, place, and time.  Psychiatric:        Mood and Affect: Mood normal.        Behavior:  Behavior normal.        Thought Content: Thought content normal.        Judgment: Judgment normal.           Assessment & Plan:   Assessment and Plan    Pulmonary Mycobacterium avium complex infection, left lung Chronic infection confirmed by biopsy and cultures. he does not have significant enough symptoms to justify antibiotic treatment. Risks of treatment outweigh benefits.   The only reason I would consider treating would be with the idea of making the radiographic abnormality smaller but that is not really justifiable if this mass is purely M avium. I will touch base with Dr. Kara as well - Monitor symptoms, no treatment initiation. - Order baseline labs for kidney and liver function, CBC - Follow up in 3 months for reassessment.   Chronic obstructive pulmonary disease (COPD) COPD likely exacerbated by smoking and environmental irritants. Minimal respiratory symptoms, not significantly contributing to current condition.      I have personally spent 62 minutes involved in face-to-face and non-face-to-face activities for this patient on the day of the visit. Professional time spent includes the following activities: Preparing to see the patient (review of cultures, labs) obtaining and/or reviewing separately obtained history bronchoscopy notes, pulmonary progress notes, multiple CT scans done over past several years., Performing a medically appropriate examination and/or evaluation , Ordering CBC CMP, referring and communicating with other health care professionals--Dr. Kara Documenting clinical information in the EMR, Independently interpreting results (not separately reported), Communicating  results to the patientr, Counseling and educating the patient/family/caregiver and Care coordination (not separately reported).

## 2024-06-11 ENCOUNTER — Encounter: Payer: Self-pay | Admitting: Infectious Disease

## 2024-06-11 ENCOUNTER — Other Ambulatory Visit: Payer: Self-pay

## 2024-06-11 ENCOUNTER — Ambulatory Visit: Admitting: Infectious Disease

## 2024-06-11 VITALS — BP 127/81 | HR 79 | Temp 97.5°F | Ht 66.0 in | Wt 150.0 lb

## 2024-06-11 DIAGNOSIS — J42 Unspecified chronic bronchitis: Secondary | ICD-10-CM

## 2024-06-11 DIAGNOSIS — A310 Pulmonary mycobacterial infection: Secondary | ICD-10-CM | POA: Diagnosis not present

## 2024-06-11 DIAGNOSIS — R911 Solitary pulmonary nodule: Secondary | ICD-10-CM | POA: Diagnosis not present

## 2024-06-11 DIAGNOSIS — F17200 Nicotine dependence, unspecified, uncomplicated: Secondary | ICD-10-CM

## 2024-06-11 DIAGNOSIS — C61 Malignant neoplasm of prostate: Secondary | ICD-10-CM | POA: Diagnosis not present

## 2024-06-11 DIAGNOSIS — R918 Other nonspecific abnormal finding of lung field: Secondary | ICD-10-CM

## 2024-06-11 LAB — CBC WITH DIFFERENTIAL/PLATELET
Absolute Lymphocytes: 897 {cells}/uL (ref 850–3900)
Absolute Monocytes: 598 {cells}/uL (ref 200–950)
Basophils Absolute: 20 {cells}/uL (ref 0–200)
Basophils Relative: 0.4 %
Eosinophils Absolute: 88 {cells}/uL (ref 15–500)
Eosinophils Relative: 1.8 %
HCT: 41.7 % (ref 38.5–50.0)
Hemoglobin: 13.9 g/dL (ref 13.2–17.1)
MCH: 31.4 pg (ref 27.0–33.0)
MCHC: 33.3 g/dL (ref 32.0–36.0)
MCV: 94.1 fL (ref 80.0–100.0)
MPV: 9.3 fL (ref 7.5–12.5)
Monocytes Relative: 12.2 %
Neutro Abs: 3298 {cells}/uL (ref 1500–7800)
Neutrophils Relative %: 67.3 %
Platelets: 185 Thousand/uL (ref 140–400)
RBC: 4.43 Million/uL (ref 4.20–5.80)
RDW: 12.4 % (ref 11.0–15.0)
Total Lymphocyte: 18.3 %
WBC: 4.9 Thousand/uL (ref 3.8–10.8)

## 2024-06-11 LAB — COMPLETE METABOLIC PANEL WITHOUT GFR
AG Ratio: 1.6 (calc) (ref 1.0–2.5)
ALT: 9 U/L (ref 9–46)
AST: 14 U/L (ref 10–35)
Albumin: 4.1 g/dL (ref 3.6–5.1)
Alkaline phosphatase (APISO): 83 U/L (ref 35–144)
BUN: 21 mg/dL (ref 7–25)
CO2: 30 mmol/L (ref 20–32)
Calcium: 9.2 mg/dL (ref 8.6–10.3)
Chloride: 101 mmol/L (ref 98–110)
Creat: 0.92 mg/dL (ref 0.70–1.22)
Globulin: 2.5 g/dL (ref 1.9–3.7)
Glucose, Bld: 80 mg/dL (ref 65–99)
Potassium: 4.2 mmol/L (ref 3.5–5.3)
Sodium: 139 mmol/L (ref 135–146)
Total Bilirubin: 1 mg/dL (ref 0.2–1.2)
Total Protein: 6.6 g/dL (ref 6.1–8.1)

## 2024-06-22 DIAGNOSIS — J439 Emphysema, unspecified: Secondary | ICD-10-CM | POA: Diagnosis not present

## 2024-06-22 DIAGNOSIS — J449 Chronic obstructive pulmonary disease, unspecified: Secondary | ICD-10-CM | POA: Diagnosis not present

## 2024-06-23 LAB — MAC SUSCEPTIBILITY BROTH
Amikacin: 8
Ciprofloxacin: 0.5
Doxycycline: 0.25
Linezolid: 4
Minocycline: 0.12
Moxifloxacin: 1
Rifabutin: 0.25
Rifampin: 0.25
Streptomycin: 16

## 2024-06-23 LAB — FUNGAL ORGANISM REFLEX

## 2024-06-23 LAB — ACID FAST SMEAR (AFB, MYCOBACTERIA): Acid Fast Smear: NEGATIVE

## 2024-06-23 LAB — ACID FAST ID BY PCR AND SUSCEPTIBILITIES
M Tuberculosis Complex: NEGATIVE
M avium complex: POSITIVE — AB

## 2024-06-23 LAB — FUNGUS CULTURE WITH STAIN

## 2024-06-23 LAB — ACID FAST CULTURE WITH REFLEXED SENSITIVITIES (MYCOBACTERIA): Acid Fast Culture: POSITIVE — AB

## 2024-06-23 LAB — FUNGUS CULTURE RESULT

## 2024-06-25 DIAGNOSIS — L821 Other seborrheic keratosis: Secondary | ICD-10-CM | POA: Diagnosis not present

## 2024-06-25 DIAGNOSIS — Z85828 Personal history of other malignant neoplasm of skin: Secondary | ICD-10-CM | POA: Diagnosis not present

## 2024-06-25 DIAGNOSIS — L570 Actinic keratosis: Secondary | ICD-10-CM | POA: Diagnosis not present

## 2024-06-25 DIAGNOSIS — L814 Other melanin hyperpigmentation: Secondary | ICD-10-CM | POA: Diagnosis not present

## 2024-06-25 DIAGNOSIS — D692 Other nonthrombocytopenic purpura: Secondary | ICD-10-CM | POA: Diagnosis not present

## 2024-06-29 DIAGNOSIS — Z8546 Personal history of malignant neoplasm of prostate: Secondary | ICD-10-CM | POA: Diagnosis not present

## 2024-06-29 DIAGNOSIS — E7800 Pure hypercholesterolemia, unspecified: Secondary | ICD-10-CM | POA: Diagnosis not present

## 2024-06-29 DIAGNOSIS — J439 Emphysema, unspecified: Secondary | ICD-10-CM | POA: Diagnosis not present

## 2024-06-29 DIAGNOSIS — J449 Chronic obstructive pulmonary disease, unspecified: Secondary | ICD-10-CM | POA: Diagnosis not present

## 2024-07-22 DIAGNOSIS — J449 Chronic obstructive pulmonary disease, unspecified: Secondary | ICD-10-CM | POA: Diagnosis not present

## 2024-07-22 DIAGNOSIS — J439 Emphysema, unspecified: Secondary | ICD-10-CM | POA: Diagnosis not present

## 2024-07-29 DIAGNOSIS — Z8546 Personal history of malignant neoplasm of prostate: Secondary | ICD-10-CM | POA: Diagnosis not present

## 2024-07-29 DIAGNOSIS — J439 Emphysema, unspecified: Secondary | ICD-10-CM | POA: Diagnosis not present

## 2024-07-29 DIAGNOSIS — E7800 Pure hypercholesterolemia, unspecified: Secondary | ICD-10-CM | POA: Diagnosis not present

## 2024-07-29 DIAGNOSIS — J449 Chronic obstructive pulmonary disease, unspecified: Secondary | ICD-10-CM | POA: Diagnosis not present

## 2024-08-21 DIAGNOSIS — J439 Emphysema, unspecified: Secondary | ICD-10-CM | POA: Diagnosis not present

## 2024-08-21 DIAGNOSIS — J449 Chronic obstructive pulmonary disease, unspecified: Secondary | ICD-10-CM | POA: Diagnosis not present

## 2024-08-29 DIAGNOSIS — Z8546 Personal history of malignant neoplasm of prostate: Secondary | ICD-10-CM | POA: Diagnosis not present

## 2024-08-29 DIAGNOSIS — J449 Chronic obstructive pulmonary disease, unspecified: Secondary | ICD-10-CM | POA: Diagnosis not present

## 2024-08-29 DIAGNOSIS — E7800 Pure hypercholesterolemia, unspecified: Secondary | ICD-10-CM | POA: Diagnosis not present

## 2024-08-29 DIAGNOSIS — J439 Emphysema, unspecified: Secondary | ICD-10-CM | POA: Diagnosis not present

## 2024-09-15 ENCOUNTER — Ambulatory Visit: Payer: Self-pay | Admitting: Infectious Disease

## 2024-09-20 DIAGNOSIS — J449 Chronic obstructive pulmonary disease, unspecified: Secondary | ICD-10-CM | POA: Diagnosis not present

## 2024-09-20 DIAGNOSIS — J439 Emphysema, unspecified: Secondary | ICD-10-CM | POA: Diagnosis not present

## 2024-09-22 NOTE — Progress Notes (Unsigned)
 Subjective:  Chief Complaint: follow-up for M avium infection   Patient ID: Brent Phelps, male    DOB: [birth year], 78 y.o.   MRN: [id number]  HPI  Past Medical History:  Diagnosis Date   COPD (chronic obstructive pulmonary disease) (HCC)    COPD (chronic obstructive pulmonary disease) (HCC) 06/10/2024   COVID 01/20/2022   pt states no symptoms   Dyspnea    with exertion   Elevated prostate specific antigen (PSA)    Erectile dysfunction    Headache    Mycobacterium avium infection (HCC) 06/10/2024   Prostate cancer (HCC)    Shortness of breath on exertion    Smoker 06/10/2024    Past Surgical History:  Procedure Laterality Date   ANAL FISSURE REPAIR  AGE 21   BRONCHIAL BIOPSY  02/06/2022   Procedure: BRONCHIAL BIOPSIES;  Surgeon: Shelah Lamar RAMAN, MD;  Location: [facility] ENDOSCOPY;  Service: Pulmonary;;   BRONCHIAL BIOPSY  05/05/2024   Procedure: BRONCHOSCOPY, WITH BIOPSY;  Surgeon: Shelah Lamar RAMAN, MD;  Location: [facility] ENDOSCOPY;  Service: Pulmonary;;   BRONCHIAL BRUSHINGS  02/06/2022   Procedure: BRONCHIAL BRUSHINGS;  Surgeon: Shelah Lamar RAMAN, MD;  Location: [facility] ENDOSCOPY;  Service: Pulmonary;;   BRONCHIAL BRUSHINGS  05/05/2024   Procedure: BRONCHOSCOPY, WITH BRUSH BIOPSY;  Surgeon: Shelah Lamar RAMAN, MD;  Location: [facility] ENDOSCOPY;  Service: Pulmonary;;   BRONCHIAL NEEDLE ASPIRATION BIOPSY  02/06/2022   Procedure: BRONCHIAL NEEDLE ASPIRATION BIOPSIES;  Surgeon: Shelah Lamar RAMAN, MD;  Location: [facility] ENDOSCOPY;  Service: Pulmonary;;   BRONCHIAL NEEDLE ASPIRATION BIOPSY  05/05/2024   Procedure: BRONCHOSCOPY, WITH NEEDLE ASPIRATION BIOPSY;  Surgeon: Shelah Lamar RAMAN, MD;  Location: [facility] ENDOSCOPY;  Service: Pulmonary;;   BRONCHIAL WASHINGS  02/06/2022   Procedure: BRONCHIAL WASHINGS;  Surgeon: Shelah Lamar RAMAN, MD;  Location: [facility] ENDOSCOPY;  Service: Pulmonary;;   BRONCHIAL WASHINGS  05/05/2024   Procedure: IRRIGATION, BRONCHUS;  Surgeon: Shelah Lamar RAMAN, MD;  Location: [facility] ENDOSCOPY;  Service: Pulmonary;;    CYSTOSCOPY  06/20/2012   Procedure: PHYLLIS SIDE;  Surgeon: Arlena LILLETTE Gal, MD;  Location: [facility];  Service: Urology;  Laterality: N/A;  no seeds found in bladder   CYSTOSCOPY  06/20/2012   Procedure: CYSTOSCOPY;  Surgeon: Arlena LILLETTE Gal, MD;  Location: [facility];  Service: Urology;;   ESOPHAGOGASTRODUODENOSCOPY N/A 07/11/2013   Procedure: ESOPHAGOGASTRODUODENOSCOPY (EGD);  Surgeon: Lynwood LITTIE Celestia Mickey., MD;  Location: [facility] ENDOSCOPY;  Service: Endoscopy;  Laterality: N/A;   FIDUCIAL MARKER PLACEMENT  02/06/2022   Procedure: FIDUCIAL MARKER PLACEMENT;  Surgeon: Shelah Lamar RAMAN, MD;  Location: [facility] ENDOSCOPY;  Service: Pulmonary;;   INJECTION OF SILICONE OIL Left 05/22/2013   Procedure: INJECTION OF SILICONE OIL;  Surgeon: Arley DELENA Ruder, MD;  Location: [facility] OR;  Service: Ophthalmology;  Laterality: Left;   PARS PLANA VITRECTOMY Left 05/22/2013   Procedure: PARS PLANA VITRECTOMY WITH 20 GAUGE WITH  ENDO LASER AND SILICONE INJECTION LEFT EYE; Reconstruction Ruptured globe, Drainage subretinal hematoma; Evacuation of anterior chamber clot.;  Surgeon: Arley DELENA Ruder, MD;  Location: [facility] OR;  Service: Ophthalmology;  Laterality: Left;   PHOTOCOAGULATION WITH LASER Left 05/22/2013   Procedure: PHOTOCOAGULATION WITH LASER;  Surgeon: Arley DELENA Ruder, MD;  Location: [facility] OR;  Service: Ophthalmology;  Laterality: Left;   RADIOACTIVE SEED IMPLANT  06/20/2012   Procedure: RADIOACTIVE SEED IMPLANT;  Surgeon: Arlena LILLETTE Gal, MD;  Location: [facility];  Service: Urology;  Laterality: N/A;  83 seeds implanted   RUPTURED  GLOBE EXPLORATION AND REPAIR Left 05/12/2013   Procedure: REPAIR OF RUPTURED GLOBE WITH CLOSURE OF LACERATION LOWER EYELID;  Surgeon: Ozell DELENA Mirza, MD;  Location: [facility] OR;  Service: Ophthalmology;  Laterality: Left;   THORACIC FUSION  1992     RE-DO T9 - T10 (PRIOR DISKECTOMY IN 1991)   VIDEO BRONCHOSCOPY WITH ENDOBRONCHIAL NAVIGATION  Left 05/05/2024   Procedure: VIDEO BRONCHOSCOPY WITH ENDOBRONCHIAL NAVIGATION;  Surgeon: Shelah Lamar RAMAN, MD;  Location: [facility] ENDOSCOPY;  Service: Pulmonary;  Laterality: Left;   VIDEO BRONCHOSCOPY WITH RADIAL ENDOBRONCHIAL ULTRASOUND  02/06/2022   Procedure: VIDEO BRONCHOSCOPY WITH RADIAL ENDOBRONCHIAL ULTRASOUND;  Surgeon: Shelah Lamar RAMAN, MD;  Location: [facility] ENDOSCOPY;  Service: Pulmonary;;    Family History  Problem Relation Age of Onset   Cancer Brother        prostate   Cancer Sister        breast   Cancer Brother        colon      Social History   Socioeconomic History   Marital status: Widowed    Spouse name: Not on file   Number of children: Not on file   Years of education: Not on file   Highest education level: Not on file  Occupational History   Not on file  Tobacco Use   Smoking status: Some Days    Current packs/day: 0.25    Average packs/day: 0.3 packs/day for 50.0 years (12.5 ttl pk-yrs)    Types: Cigarettes   Smokeless tobacco: Never   Tobacco comments:    PT CUT DOWN FROM 1 1/2 PPD TO 6 CIG DAILY  Vaping Use   Vaping status: Never Used  Substance and Sexual Activity   Alcohol use: No   Drug use: No   Sexual activity: Not on file  Other Topics Concern   Not on file  Social History Narrative   Not on file   Social Drivers of Health   Financial Resource Strain: Not on file  Food Insecurity: Not on file  Transportation Needs: Not on file  Physical Activity: Not on file  Stress: Not on file  Social Connections: Not on file    Allergies  Allergen Reactions   Codeine Nausea And Vomiting    Can take for a short time      Current Outpatient Medications:    albuterol  (PROVENTIL ) (2.5 MG/3ML) 0.083% nebulizer solution, USE 1 VIAL IN NEBULIZER EVERY 6 HOURS - as needed for wheezing or shortness of breath, Disp: 3 mL, Rfl: 11   albuterol  (VENTOLIN  HFA) 108 (90 Base) MCG/ACT inhaler, INHALE 2 PUFFS INTO THE LUNGS EVERY 6 HOURS AS NEEDED FOR WHEEZING,  Disp: 6.7 g, Rfl: 11   fluticasone -salmeterol (ADVAIR HFA) 230-21 MCG/ACT inhaler, INHALE 2 PUFFS INTO THE LUNGS TWICE DAILY, Disp: 12 g, Rfl: 2    Review of Systems     Objective:   Physical Exam        Assessment & Plan:

## 2024-09-23 ENCOUNTER — Other Ambulatory Visit: Payer: Self-pay

## 2024-09-23 ENCOUNTER — Ambulatory Visit: Admitting: Infectious Disease

## 2024-09-23 ENCOUNTER — Encounter: Payer: Self-pay | Admitting: Infectious Disease

## 2024-09-23 VITALS — BP 144/89 | HR 82 | Temp 97.2°F | Ht 66.5 in | Wt 151.0 lb

## 2024-09-23 DIAGNOSIS — J42 Unspecified chronic bronchitis: Secondary | ICD-10-CM

## 2024-09-23 DIAGNOSIS — C61 Malignant neoplasm of prostate: Secondary | ICD-10-CM

## 2024-09-23 DIAGNOSIS — A310 Pulmonary mycobacterial infection: Secondary | ICD-10-CM

## 2024-09-28 DIAGNOSIS — J449 Chronic obstructive pulmonary disease, unspecified: Secondary | ICD-10-CM | POA: Diagnosis not present

## 2024-09-28 DIAGNOSIS — J439 Emphysema, unspecified: Secondary | ICD-10-CM | POA: Diagnosis not present

## 2024-09-28 DIAGNOSIS — E7800 Pure hypercholesterolemia, unspecified: Secondary | ICD-10-CM | POA: Diagnosis not present

## 2024-09-28 DIAGNOSIS — Z8546 Personal history of malignant neoplasm of prostate: Secondary | ICD-10-CM | POA: Diagnosis not present

## 2024-10-01 DIAGNOSIS — Z8546 Personal history of malignant neoplasm of prostate: Secondary | ICD-10-CM | POA: Diagnosis not present

## 2024-10-01 DIAGNOSIS — Z23 Encounter for immunization: Secondary | ICD-10-CM | POA: Diagnosis not present

## 2024-10-01 DIAGNOSIS — E7800 Pure hypercholesterolemia, unspecified: Secondary | ICD-10-CM | POA: Diagnosis not present

## 2024-10-01 DIAGNOSIS — I700 Atherosclerosis of aorta: Secondary | ICD-10-CM | POA: Diagnosis not present

## 2024-10-01 DIAGNOSIS — Z0000 Encounter for general adult medical examination without abnormal findings: Secondary | ICD-10-CM | POA: Diagnosis not present

## 2025-09-15 ENCOUNTER — Ambulatory Visit: Admitting: Infectious Disease
